# Patient Record
Sex: Female | Born: 1965
Health system: Southern US, Community
[De-identification: ages and names within clinical notes are randomized; demographics above are authoritative.]

## PROBLEM LIST (undated history)

## (undated) DIAGNOSIS — K219 Gastro-esophageal reflux disease without esophagitis: Secondary | ICD-10-CM

## (undated) DIAGNOSIS — N200 Calculus of kidney: Secondary | ICD-10-CM

## (undated) DIAGNOSIS — R51 Headache: Secondary | ICD-10-CM

## (undated) DIAGNOSIS — Z87442 Personal history of urinary calculi: Secondary | ICD-10-CM

## (undated) DIAGNOSIS — M7989 Other specified soft tissue disorders: Secondary | ICD-10-CM

## (undated) DIAGNOSIS — I499 Cardiac arrhythmia, unspecified: Secondary | ICD-10-CM

## (undated) DIAGNOSIS — R0602 Shortness of breath: Secondary | ICD-10-CM

## (undated) DIAGNOSIS — N809 Endometriosis, unspecified: Secondary | ICD-10-CM

## (undated) DIAGNOSIS — Z9889 Other specified postprocedural states: Secondary | ICD-10-CM

## (undated) DIAGNOSIS — I4891 Unspecified atrial fibrillation: Secondary | ICD-10-CM

## (undated) DIAGNOSIS — M352 Behcet's disease: Secondary | ICD-10-CM

## (undated) DIAGNOSIS — I480 Paroxysmal atrial fibrillation: Secondary | ICD-10-CM

## (undated) DIAGNOSIS — I10 Essential (primary) hypertension: Secondary | ICD-10-CM

## (undated) DIAGNOSIS — G4730 Sleep apnea, unspecified: Secondary | ICD-10-CM

## (undated) HISTORY — DX: Headache: R51

## (undated) HISTORY — DX: Behcet's disease: M35.2

## (undated) HISTORY — DX: Unspecified atrial fibrillation: I48.91

## (undated) HISTORY — DX: Shortness of breath: R06.02

## (undated) HISTORY — PX: APPENDECTOMY: SHX54

## (undated) HISTORY — PX: FOOT SURGERY: SHX648

## (undated) HISTORY — DX: Endometriosis, unspecified: N80.9

## (undated) HISTORY — DX: Calculus of kidney: N20.0

## (undated) HISTORY — DX: Paroxysmal atrial fibrillation: I48.0

## (undated) HISTORY — DX: Other specified soft tissue disorders: M79.89

## (undated) HISTORY — PX: ABDOMINAL HYSTERECTOMY: SHX81

---

## 2001-05-14 ENCOUNTER — Encounter (HOSPITAL_COMMUNITY): Admission: RE | Admit: 2001-05-14 | Discharge: 2001-06-13 | Payer: Self-pay | Admitting: Preventative Medicine

## 2001-05-21 ENCOUNTER — Encounter (HOSPITAL_COMMUNITY): Admission: RE | Admit: 2001-05-21 | Discharge: 2001-06-20 | Payer: Self-pay | Admitting: Orthopaedic Surgery

## 2004-09-16 ENCOUNTER — Ambulatory Visit (HOSPITAL_COMMUNITY): Admission: RE | Admit: 2004-09-16 | Discharge: 2004-09-16 | Payer: Self-pay | Admitting: Family Medicine

## 2006-03-07 ENCOUNTER — Ambulatory Visit: Payer: Self-pay | Admitting: Obstetrics and Gynecology

## 2007-03-09 ENCOUNTER — Ambulatory Visit: Payer: Self-pay | Admitting: Obstetrics and Gynecology

## 2008-03-18 ENCOUNTER — Ambulatory Visit: Payer: Self-pay | Admitting: Obstetrics and Gynecology

## 2013-03-20 ENCOUNTER — Encounter: Payer: Self-pay | Admitting: *Deleted

## 2013-03-21 ENCOUNTER — Ambulatory Visit (INDEPENDENT_AMBULATORY_CARE_PROVIDER_SITE_OTHER): Payer: Managed Care, Other (non HMO) | Admitting: Obstetrics and Gynecology

## 2013-03-21 ENCOUNTER — Encounter: Payer: Self-pay | Admitting: Obstetrics and Gynecology

## 2013-03-21 VITALS — BP 158/82 | Ht 64.5 in | Wt 198.0 lb

## 2013-03-21 DIAGNOSIS — N951 Menopausal and female climacteric states: Secondary | ICD-10-CM | POA: Insufficient documentation

## 2013-03-21 DIAGNOSIS — Z1212 Encounter for screening for malignant neoplasm of rectum: Secondary | ICD-10-CM

## 2013-03-21 DIAGNOSIS — Z01419 Encounter for gynecological examination (general) (routine) without abnormal findings: Secondary | ICD-10-CM

## 2013-03-21 LAB — HEMOCCULT GUIAC POC 1CARD (OFFICE): Fecal Occult Blood, POC: NEGATIVE

## 2013-03-21 MED ORDER — ZOLPIDEM TARTRATE 10 MG PO TABS: 10.0000 mg | ORAL_TABLET | Freq: Every evening | ORAL | Status: AC | PRN

## 2013-03-21 NOTE — Progress Notes (Signed)
Patient ID: Holly Krueger, female   DOB: 1966-02-23, 47 y.o.   MRN: [id number] Pt here today for annual exam, c/o hot flashes. Pt states had hysterectomy in 2002. Assessment:  Normal Gyn Exam Perimenopause   Plan:  1. Mammogram 2. No pap, s/p hyst 3. return annually or prn 4. Rx Estradiol 1mg  /d Subjective:  Holly Krueger is a 47 y.o. female G1P1 who presents for annual exam.  The patient has complaints today of vasomotor sx    Review of Systems Pertinent items are noted in HPI.  Objective:  BP 158/82  Ht 5' 4.5" (1.638 m)  Wt 198 lb (89.812 kg)  BMI 33.47 kg/m2  BMI: Body mass index is 33.47 kg/(m^2). General Appearance: Alert, appropriate appearance for age. No acute distress HEENT: Grossly normal Neck / Thyroid:  Cardiovascular: RRR; normal S1, S2, no murmur Lungs: CTA bilaterally Back: No CVAT Breast Exam: No dimpling, nipple retraction or discharge. No masses or nodes. and No masses or nodes.No dimpling, nipple retraction or discharge. Gastrointestinal: Soft, non-tender, no masses or organomegaly Pelvic Exam: External genitalia: normal general appearance Urinary system: urethral meatus normal Vaginal: normal mucosa without prolapse or lesions and cystocele present, mild Cervix: removed surgically Uterus: absent atrophic tissues Rectovaginal: normal rectal, no masses Lymphatic Exam: Non-palpable nodes in neck, clavicular, axillary, or inguinal regions Skin: no rash or abnormalities Neurologic: Normal gait and speech, no tremor  Psychiatric: Alert and oriented, appropriate affect.  Urinalysis:normal and Not done  Christin Bach. MD Pgr [phone] 2:56 PM

## 2013-03-21 NOTE — Patient Instructions (Addendum)
Remus Loffler should 5 mg daily up to 5 times/week maximum. Menopause and Herbal Products Menopause is the normal time of life when menstrual periods stop completely. Menopause is complete when you have missed 12 consecutive menstrual periods. It usually occurs between the ages of 63 to 43, with an average age of 52. Very rarely does a woman develop menopause before 49 years old. At menopause, your ovaries stop producing the female hormones, estrogen and progesterone. This can cause undesirable symptoms and also affect your health. Sometimes the symptoms can occur 4 to 5 years before the menopause begins. There is no relationship between menopause and:  Oral contraceptives.  Number of children you had.  Race.  The age your menstrual periods started (menarche). Heavy smokers and very thin women may develop menopause earlier in life. Estrogen and progesterone hormone treatment is the usual method of treating menopausal symptoms. However, there are women who should not take hormone treatment. This is true of:   Women that have breast or uterine cancer.  Women who prefer not to take hormones because of certain side effects (abnormal uterine bleeding).  Women who are afraid that hormones may cause breast cancer.  Women who have a history of liver disease, heart disease, stroke, or blood clots. For these women, there are other medications that may help treat their menopausal symptoms. These medications are found in plants and botanical products. They can be found in the form of herbs, teas, oils, tinctures, and pills.  CAUSES:  The ovaries stop producing the female hormones estrogen and progesterone.  Other causes include:  Surgery to remove both ovaries.  The ovaries stop functioning for no know reason.  Tumors of the pituitary gland in the brain.  Medical disease that affects the ovaries and hormone production.  Radiation treatment to the abdomen or pelvis.  Chemotherapy that affects the  ovaries. PHYTOESTROGENS: Phytoestrogens occur naturally in plants and plant products. They act like estrogen in the body. Herbal medications are made from these plants and botanical steroids. There are 3 types of phytoestrogens:  Isoflavones (genistein and daidzein) are found in soy, garbanzo beans, miso and tofu foods.  Ligins are found in the shell of seeds. They are used to make oils like flaxseed oil. The bacteria in your intestine act on these foods to produce the estrogen-like hormones.  Coumestans are estrogen-like. Some of the foods they are found in include sunflower seeds and bean sprouts. CONDITIONS AND THEIR POSSIBLE HERBAL TREATMENT:  Hot flashes and night sweats.  Soy, black cohosh and evening primrose.  Irritability, insomnia, depression and memory problems.  Chasteberry, ginseng, and soy.  St. Maressa Apollo's wort may be helpful for depression. However, there is a concern of it causing cataracts of the eye and may have bad effects on other medications. St. Laster Appling's wort should not be taken for long time and without your caregiver's advice.  Loss of libido and vaginal and skin dryness.  Wild yam and soy.  Prevention of coronary heart disease and osteoporosis.  Soy and Isoflavones. Several studies have shown that some women benefit from herbal medications, but most of the studies have not consistently shown that these supplements are much better than placebo. Other forms of treatment to help women with menopausal symptoms include a balanced diet, rest, exercise, vitamin and calcium (with vitamin D) supplements, acupuncture, and group therapy when necessary. THOSE WHO SHOULD NOT TAKE HERBAL MEDICATIONS INCLUDE:  Women who are planning on getting pregnant unless told by your caregiver.  Women who are breastfeeding unless  told by your caregiver.  Women who are taking other prescription medications unless told by your caregiver.  Infants, children, and elderly women unless told by  your caregiver. Different herbal medications have different and unmeasured amounts of the herbal ingredients. There are no regulations, quality control, and standardization of the ingredients in herbal medications. Therefore, the amount of the ingredient in the medication may vary from one herb, pill, tea, oil or tincture to another. Many herbal medications can cause serious problems and can even have poisonous effects if taken too much or too long. If problems develop, the medication should be stopped and recorded by your caregiver. HOME CARE INSTRUCTIONS  Do not take or give children herbal medications without your caregiver's advice.  Let your caregiver know all the medications you are taking. This includes prescription, over-the-counter, eye drops, and creams.  Do not take herbal medications longer or more than recommended.  Tell your caregiver about any side effects from the medication. SEEK MEDICAL CARE IF:  You develop a fever of 102 F (38.9 C), or as directed by your caregiver.  You feel sick to your stomach (nauseous), vomit, or have diarrhea.  You develop a rash.  You develop abdominal pain.  You develop severe headaches.  You start to have vision problems.  You feel dizzy or faint.  You start to feel numbness in any part of your body.  You start shaking (have convulsions). Document Released: 03/21/2008 Document Revised: 09/19/2012 Document Reviewed: 10/19/2010 [facility] Patient Information 2014 [place], [place].   Go to [facility] for info

## 2013-09-30 ENCOUNTER — Other Ambulatory Visit (HOSPITAL_COMMUNITY): Payer: Self-pay | Admitting: Internal Medicine

## 2013-09-30 DIAGNOSIS — Z139 Encounter for screening, unspecified: Secondary | ICD-10-CM

## 2013-10-04 ENCOUNTER — Ambulatory Visit (HOSPITAL_COMMUNITY)
Admission: RE | Admit: 2013-10-04 | Discharge: 2013-10-04 | Disposition: A | Discharge status: Home / Self Care | Payer: Managed Care, Other (non HMO) | Source: Ambulatory Visit | Attending: Internal Medicine | Admitting: Internal Medicine

## 2013-10-04 DIAGNOSIS — Z1231 Encounter for screening mammogram for malignant neoplasm of breast: Secondary | ICD-10-CM | POA: Insufficient documentation

## 2013-10-04 DIAGNOSIS — Z139 Encounter for screening, unspecified: Secondary | ICD-10-CM

## 2013-10-07 ENCOUNTER — Ambulatory Visit (HOSPITAL_COMMUNITY): Payer: Self-pay

## 2013-12-04 ENCOUNTER — Ambulatory Visit (INDEPENDENT_AMBULATORY_CARE_PROVIDER_SITE_OTHER): Payer: Managed Care, Other (non HMO) | Admitting: Obstetrics and Gynecology

## 2013-12-04 ENCOUNTER — Telehealth: Payer: Self-pay | Admitting: *Deleted

## 2013-12-04 ENCOUNTER — Encounter: Payer: Self-pay | Admitting: Obstetrics and Gynecology

## 2013-12-04 ENCOUNTER — Encounter (INDEPENDENT_AMBULATORY_CARE_PROVIDER_SITE_OTHER): Payer: Self-pay

## 2013-12-04 VITALS — BP 122/76 | Ht 64.0 in | Wt 205.0 lb

## 2013-12-04 DIAGNOSIS — R300 Dysuria: Secondary | ICD-10-CM

## 2013-12-04 DIAGNOSIS — N8111 Cystocele, midline: Secondary | ICD-10-CM

## 2013-12-04 DIAGNOSIS — R309 Painful micturition, unspecified: Secondary | ICD-10-CM

## 2013-12-04 DIAGNOSIS — IMO0002 Reserved for concepts with insufficient information to code with codable children: Secondary | ICD-10-CM

## 2013-12-04 LAB — POCT URINALYSIS DIPSTICK
Blood, UA: NEGATIVE
Glucose, UA: NEGATIVE
Ketones, UA: NEGATIVE
Leukocytes, UA: NEGATIVE
Nitrite, UA: NEGATIVE
Protein, UA: NEGATIVE

## 2013-12-04 MED ORDER — PHENAZOPYRIDINE HCL 200 MG PO TABS: 200.0000 mg | ORAL_TABLET | Freq: Three times a day (TID) | ORAL | Status: DC | PRN

## 2013-12-04 MED ORDER — SULFAMETHOXAZOLE-TMP DS 800-160 MG PO TABS: 1.0000 | ORAL_TABLET | Freq: Two times a day (BID) | ORAL | Status: DC

## 2013-12-04 NOTE — Telephone Encounter (Signed)
Spoke with pt. Having pressure, pain with urination. Started today. Can you order Rx? Pt is at work at [employer]. Uses [facility] in [place]. Thanks!!!

## 2013-12-04 NOTE — Patient Instructions (Addendum)
  We will send urine culture for confirmation of uti

## 2013-12-04 NOTE — Progress Notes (Signed)
This chart was scribed by Jenne Campus, Medical Scribe, for Dr. Mallory Shirk on 12/04/13 at 3:48 PM. This chart was reviewed by Dr. Mallory Shirk and is accurate.    [facility] Visit  Patient name: Holly Krueger MRN [id number]  Date of birth: 1966/07/11  CC & HPI:  Holly Krueger is a 48 y.o. female presenting today for pain and pressure with urination that started today. Denies any other symptoms. Reports that sxs are similar to prior bladder infections in the past. No infections recently. Denies increased water intake.  ROS:  + dysuria and nocturia with urgency Denies any other symptoms  Pertinent History Reviewed:  Medical & Surgical Hx:  Reviewed: Significant for s/p hysterectomy Medications: Reviewed & Updated - see associated section Social History: Reviewed -  reports that she has never smoked. She has never used smokeless tobacco.  Objective Findings:  Vitals: BP 122/76  Ht 5\' 4"  (1.626 m)  Wt 205 lb (92.987 kg)  BMI 35.17 kg/m2 Chaperone present for exam. Exam performed with pt's permission Physical Examination: General appearance - alert, well appearing, and in no distress and oriented to person, place, and time Mental status - alert, oriented to person, place, and time, normal mood, behavior, speech, dress, motor activity, and thought processes Pelvic - VULVA: normal appearing vulva with no masses, tenderness or lesions, VAGINA: normal appearing vagina with normal color and discharge, no lesions, small cystocele noted CERVIX: normal appearing cervix without discharge or lesions, UTERUS: absent, ADNEXA: surgically absent bilateral, RECTAL: not done Negative UA  Assessment & Plan:  UA culture. Septra DS x3 days BID, also Pyridium 200 TID

## 2013-12-04 NOTE — Telephone Encounter (Signed)
Seen today as appt.

## 2013-12-07 LAB — URINE CULTURE: Colony Count: 60000

## 2013-12-23 ENCOUNTER — Encounter: Payer: Self-pay | Admitting: Obstetrics and Gynecology

## 2013-12-23 ENCOUNTER — Ambulatory Visit (INDEPENDENT_AMBULATORY_CARE_PROVIDER_SITE_OTHER): Payer: Managed Care, Other (non HMO) | Admitting: Obstetrics and Gynecology

## 2013-12-23 VITALS — BP 142/88 | Ht 64.0 in | Wt 209.4 lb

## 2013-12-23 DIAGNOSIS — N8111 Cystocele, midline: Secondary | ICD-10-CM

## 2013-12-23 DIAGNOSIS — R300 Dysuria: Secondary | ICD-10-CM

## 2013-12-23 DIAGNOSIS — IMO0002 Reserved for concepts with insufficient information to code with codable children: Secondary | ICD-10-CM

## 2013-12-23 DIAGNOSIS — R309 Painful micturition, unspecified: Secondary | ICD-10-CM

## 2013-12-23 LAB — POCT URINALYSIS DIPSTICK
Blood, UA: NEGATIVE
Glucose, UA: NEGATIVE
Ketones, UA: NEGATIVE
Leukocytes, UA: NEGATIVE
Nitrite, UA: NEGATIVE
Protein, UA: NEGATIVE

## 2013-12-23 NOTE — Progress Notes (Signed)
This chart was scribed by Ludger Nutting, Medical Scribe, for Dr. Mallory Shirk on 12/23/13 at 2:43 PM. This chart was reviewed by Dr. Mallory Shirk and is accurate.  [facility] Visit  Patient name: Holly Krueger MRN [id number]  Date of birth: Nov 18, 1965  CC & HPI:  Reina Wilton is a 48 y.o. female presenting today for a cystocele follow up. She was diagnosed with a UTI on 12/04/13.   ROS:  Negative except for noted above  Pertinent History Reviewed:  Medical & Surgical Hx:  Reviewed: Significant for  Past Medical History  Diagnosis Date  . Headache(784.0)   . Kidney stone   . Endometriosis    Past Surgical History  Procedure Laterality Date  . Appendectomy    . Abdominal hysterectomy    . Foot surgery      Medications: Reviewed & Updated - see associated section Social History: Reviewed -  reports that she has never smoked. She has never used smokeless tobacco.  Objective Findings:  Vitals: BP 142/88  Ht 5\' 4"  (1.626 m)  Wt 209 lb 6.4 oz (94.983 kg)  BMI 35.93 kg/m2  Physical Examination: General appearance - alert, well appearing, and in no distress and oriented to person, place, and time Pelvic - VULVA: normal appearing vulva with no masses, tenderness or lesions,  VAGINA: normal appearing vagina with normal color and discharge, no lesions,  CERVIX: Not indicated UTERUS:Not indicated ADNEXA: Not indicated Bladder: 5.8 cm x 2.0 cm x3.4 cm   Assessment & Plan:   A: 1. Normal post void residual 15 cc 2. Cystocele   P:  1. Follow for now.

## 2014-02-11 ENCOUNTER — Other Ambulatory Visit (HOSPITAL_COMMUNITY): Payer: Self-pay | Admitting: Family Medicine

## 2014-02-11 ENCOUNTER — Encounter (INDEPENDENT_AMBULATORY_CARE_PROVIDER_SITE_OTHER): Payer: Self-pay

## 2014-02-11 ENCOUNTER — Ambulatory Visit (HOSPITAL_COMMUNITY)
Admission: RE | Admit: 2014-02-11 | Discharge: 2014-02-11 | Disposition: A | Discharge status: Home / Self Care | Payer: Managed Care, Other (non HMO) | Source: Ambulatory Visit | Attending: Family Medicine | Admitting: Family Medicine

## 2014-02-11 DIAGNOSIS — R079 Chest pain, unspecified: Secondary | ICD-10-CM | POA: Insufficient documentation

## 2014-02-11 DIAGNOSIS — J209 Acute bronchitis, unspecified: Secondary | ICD-10-CM

## 2014-02-11 DIAGNOSIS — R059 Cough, unspecified: Secondary | ICD-10-CM | POA: Insufficient documentation

## 2014-02-11 DIAGNOSIS — R0989 Other specified symptoms and signs involving the circulatory and respiratory systems: Secondary | ICD-10-CM | POA: Insufficient documentation

## 2014-02-11 DIAGNOSIS — R05 Cough: Secondary | ICD-10-CM | POA: Insufficient documentation

## 2014-02-11 DIAGNOSIS — R0602 Shortness of breath: Secondary | ICD-10-CM | POA: Insufficient documentation

## 2014-02-27 ENCOUNTER — Encounter (HOSPITAL_COMMUNITY): Payer: Self-pay | Admitting: Emergency Medicine

## 2014-02-27 ENCOUNTER — Emergency Department (HOSPITAL_COMMUNITY)
Admission: EM | Admit: 2014-02-27 | Discharge: 2014-02-27 | Disposition: A | Discharge status: Home / Self Care | Payer: Managed Care, Other (non HMO) | Attending: Emergency Medicine | Admitting: Emergency Medicine

## 2014-02-27 DIAGNOSIS — R209 Unspecified disturbances of skin sensation: Secondary | ICD-10-CM | POA: Insufficient documentation

## 2014-02-27 DIAGNOSIS — Z8742 Personal history of other diseases of the female genital tract: Secondary | ICD-10-CM | POA: Insufficient documentation

## 2014-02-27 DIAGNOSIS — Z79899 Other long term (current) drug therapy: Secondary | ICD-10-CM | POA: Insufficient documentation

## 2014-02-27 DIAGNOSIS — Z792 Long term (current) use of antibiotics: Secondary | ICD-10-CM | POA: Insufficient documentation

## 2014-02-27 DIAGNOSIS — Z87442 Personal history of urinary calculi: Secondary | ICD-10-CM | POA: Insufficient documentation

## 2014-02-27 DIAGNOSIS — R110 Nausea: Secondary | ICD-10-CM | POA: Insufficient documentation

## 2014-02-27 DIAGNOSIS — R42 Dizziness and giddiness: Secondary | ICD-10-CM | POA: Insufficient documentation

## 2014-02-27 LAB — CBC WITH DIFFERENTIAL/PLATELET
Basophils Absolute: 0 10*3/uL (ref 0.0–0.1)
Basophils Relative: 1 % (ref 0–1)
EOS PCT: 1 % (ref 0–5)
Eosinophils Absolute: 0.1 10*3/uL (ref 0.0–0.7)
HCT: 42.6 % (ref 36.0–46.0)
HEMOGLOBIN: 14.6 g/dL (ref 12.0–15.0)
Lymphocytes Relative: 20 % (ref 12–46)
Lymphs Abs: 1.3 10*3/uL (ref 0.7–4.0)
MCH: 30 pg (ref 26.0–34.0)
MCHC: 34.3 g/dL (ref 30.0–36.0)
MCV: 87.7 fL (ref 78.0–100.0)
Monocytes Absolute: 0.5 10*3/uL (ref 0.1–1.0)
Monocytes Relative: 7 % (ref 3–12)
Neutro Abs: 4.7 10*3/uL (ref 1.7–7.7)
Neutrophils Relative %: 71 % (ref 43–77)
Platelets: 273 10*3/uL (ref 150–400)
RBC: 4.86 MIL/uL (ref 3.87–5.11)
RDW: 12.8 % (ref 11.5–15.5)
WBC: 6.6 10*3/uL (ref 4.0–10.5)

## 2014-02-27 LAB — BASIC METABOLIC PANEL
BUN: 16 mg/dL (ref 6–23)
CO2: 22 mEq/L (ref 19–32)
Calcium: 9.5 mg/dL (ref 8.4–10.5)
Chloride: 104 mEq/L (ref 96–112)
Creatinine, Ser: 0.82 mg/dL (ref 0.50–1.10)
GFR calc Af Amer: >90 mL/min (ref >90)
GFR calc non Af Amer: 82 mL/min — ABNORMAL LOW (ref >90)
Glucose, Bld: 143 mg/dL — ABNORMAL HIGH (ref 70–99)
Potassium: 3.9 mEq/L (ref 3.7–5.3)
Sodium: 141 mEq/L (ref 137–147)

## 2014-02-27 MED ORDER — MECLIZINE HCL 25 MG PO TABS: 25.0000 mg | ORAL_TABLET | Freq: Three times a day (TID) | ORAL | Status: DC | PRN

## 2014-02-27 MED ORDER — MECLIZINE HCL 12.5 MG PO TABS
25.0000 mg | ORAL_TABLET | Freq: Once | ORAL | Status: AC
  Administered 2014-02-27: 25 mg via ORAL
  Filled 2014-02-27: qty 2

## 2014-02-27 MED ORDER — LORAZEPAM 2 MG/ML IJ SOLN
1.0000 mg | Freq: Once | INTRAMUSCULAR | Status: AC
  Administered 2014-02-27: 1 mg via INTRAVENOUS
  Filled 2014-02-27: qty 1

## 2014-02-27 MED ORDER — SODIUM CHLORIDE 0.9 % IV BOLUS (SEPSIS)
1000.0000 mL | Freq: Once | INTRAVENOUS | Status: AC
  Administered 2014-02-27: 1000 mL via INTRAVENOUS

## 2014-02-27 NOTE — ED Notes (Signed)
Attempted to perform postural vs on pt, unable to tolerate at this time

## 2014-02-27 NOTE — Discharge Instructions (Signed)
Meclizine as prescribed as needed for dizziness.  Return to the emergency department if you develop severe headache, worsening dizziness, or other new and concerning symptoms.   Benign Positional Vertigo Vertigo means you feel like you or your surroundings are moving when they are not. Benign positional vertigo is the most common form of vertigo. Benign means that the cause of your condition is not serious. Benign positional vertigo is more common in older adults. CAUSES  Benign positional vertigo is the result of an upset in the labyrinth system. This is an area in the middle ear that helps control your balance. This may be caused by a viral infection, head injury, or repetitive motion. However, often no specific cause is found. SYMPTOMS  Symptoms of benign positional vertigo occur when you move your head or eyes in different directions. Some of the symptoms may include:  Loss of balance and falls.  Vomiting.  Blurred vision.  Dizziness.  Nausea.  Involuntary eye movements (nystagmus). DIAGNOSIS  Benign positional vertigo is usually diagnosed by physical exam. If the specific cause of your benign positional vertigo is unknown, your caregiver may perform imaging tests, such as magnetic resonance imaging (MRI) or computed tomography (CT). TREATMENT  Your caregiver may recommend movements or procedures to correct the benign positional vertigo. Medicines such as meclizine, benzodiazepines, and medicines for nausea may be used to treat your symptoms. In rare cases, if your symptoms are caused by certain conditions that affect the inner ear, you may need surgery. HOME CARE INSTRUCTIONS   Follow your caregiver's instructions.  Move slowly. Do not make sudden body or head movements.  Avoid driving.  Avoid operating heavy machinery.  Avoid performing any tasks that would be dangerous to you or others during a vertigo episode.  Drink enough fluids to keep your urine clear or pale  yellow. SEEK IMMEDIATE MEDICAL CARE IF:   You develop problems with walking, weakness, numbness, or using your arms, hands, or legs.  You have difficulty speaking.  You develop severe headaches.  Your nausea or vomiting continues or gets worse.  You develop visual changes.  Your family or friends notice any behavioral changes.  Your condition gets worse.  You have a fever.  You develop a stiff neck or sensitivity to light. MAKE SURE YOU:   Understand these instructions.  Will watch your condition.  Will get help right away if you are not doing well or get worse. Document Released: 07/11/2006 Document Revised: 12/26/2011 Document Reviewed: 06/23/2011 [facility] Patient Information 2014 [place].

## 2014-02-27 NOTE — ED Notes (Signed)
Complain of nausea and dizziness. Pt hyperventilating

## 2014-02-27 NOTE — ED Notes (Signed)
Pt states improvement in nausea and dizziness as of this time.

## 2014-02-27 NOTE — ED Provider Notes (Signed)
CSN: [id number]     Arrival date & time 02/27/14  5631 History  This chart was scribed for Veryl Speak, MD,  by Stacy Gardner, ED Scribe. The patient was seen in room APA01/APA01 and the patient's care was started at 9:10 AM.    First MD Initiated Contact with Patient 02/27/14 [phone]     Chief Complaint  Patient presents with  . Dizziness     (Consider location/radiation/quality/duration/timing/severity/associated sxs/prior Treatment) Patient is a 48 y.o. female presenting with dizziness. The history is provided by the patient and medical records. No language interpreter was used.  Dizziness Associated symptoms: nausea   Associated symptoms: no vomiting    HPI Comments: Holly Krueger is a 48 y.o. female who presents to the Emergency Department complaining of dizziness, onset last night. She feels like the room is spinning,  which is worse when she turns her head. She is dry heaving. Denies vomiting. She has numbness and tingling to her left hand. Pt was sick for the past two weeks and just returned back to work this week. She is taking Ambien on a daily basis.   She is a [employer] manager.   Past Medical History  Diagnosis Date  . Headache(784.0)   . Kidney stone   . Endometriosis    Past Surgical History  Procedure Laterality Date  . Appendectomy    . Abdominal hysterectomy    . Foot surgery     Family History  Problem Relation Age of Onset  . Hyperlipidemia Mother   . Hypertension Father    History  Substance Use Topics  . Smoking status: Never Smoker   . Smokeless tobacco: Never Used  . Alcohol Use: No   OB History   Grav Para Term Preterm Abortions TAB SAB Ect Mult Living   1 1        1      Review of Systems  Gastrointestinal: Positive for nausea. Negative for vomiting.  Neurological: Positive for dizziness and numbness.       Tingling to left hand  All other systems reviewed and are negative.     Allergies  Codeine and Hydrocodone  Home Medications   Prior to  Admission medications   Medication Sig Start Date End Date Taking? Authorizing Provider  phenazopyridine (PYRIDIUM) 200 MG tablet Take 1 tablet (200 mg total) by mouth 3 (three) times daily as needed for pain. 12/04/13   Jonnie Kind, MD  sulfamethoxazole-trimethoprim (BACTRIM DS) 800-160 MG per tablet Take 1 tablet by mouth 2 (two) times daily. 12/04/13   Jonnie Kind, MD  zolpidem (AMBIEN) 10 MG tablet Take 1 tablet (10 mg total) by mouth at bedtime as needed for sleep. Up 5 times weekly 03/21/13 12/04/13  Jonnie Kind, MD   BP 137/88  Pulse 53  Resp 26  Ht 5\' 4"  (1.626 m)  Wt 196 lb (88.905 kg)  BMI 33.63 kg/m2  SpO2 100% Physical Exam  Nursing note and vitals reviewed. Constitutional: She is oriented to person, place, and time. She appears well-developed and well-nourished.  Appears anxious and uncomfortable   HENT:  Head: Normocephalic and atraumatic.  Mouth/Throat: Oropharynx is clear and moist. No oropharyngeal exudate.  Eyes: EOM are normal. Pupils are equal, round, and reactive to light.  Neck: Normal range of motion.  Cardiovascular: Normal rate, regular rhythm and normal heart sounds.  Exam reveals no gallop and no friction rub.   No murmur heard. Pulmonary/Chest: Effort normal and breath sounds normal. No respiratory distress. She has  no wheezes. She has no rales.  Neurological: She is alert and oriented to person, place, and time. Coordination normal.  Skin: Skin is warm and dry.  Psychiatric: She has a normal mood and affect. Her behavior is normal.    ED Course  Procedures (including critical care time) DIAGNOSTIC STUDIES: Oxygen Saturation is 100% on room air, normal by my interpretation.    COORDINATION OF CARE:  9:14 AM Discussed course of care with pt which includes EKG, laboratory tests, Antivert and Ativan . Pt understands and agrees.    Labs Review Labs Reviewed - No data to display  Imaging Review No results found.   EKG  Interpretation   Date/Time:  Thursday Feb 27 2014 09:03:26 EDT Ventricular Rate:  51 PR Interval:  145 QRS Duration: 106 QT Interval:  458 QTC Calculation: 422 R Axis:   -27 Text Interpretation:  Sinus rhythm Abnormal inferior Q waves No priors for  comparison Confirmed by DELOS  MD, Dorthula Bier ([id number]) on 02/27/2014 9:33:03 AM      MDM   Final diagnoses:  None    Patient is a 48 year old female who presents with complaints of dizziness. Her symptoms sound vertiginous in nature. They are worsened with movement and turning head and improved with lying still. She was given Ativan and she appeared quite anxious and meclizine and is now feeling much better.  Workup reveals no abnormalities in electrolytes, sugar, or blood counts. Her EKG is normal. At this point I feel as though she is appropriate for discharge and is to return if her symptoms substantially worsen or change. She will be prescribed meclizine as needed if symptoms recur.  I personally performed the services described in this documentation, which was scribed in my presence. The recorded information has been reviewed and is accurate.      Veryl Speak, MD 02/27/14 1055

## 2014-02-27 NOTE — ED Notes (Signed)
Pt left via wheel chair with husband- Discharge instructions and prescriptions reviewed. Verbalized understanding

## 2014-08-18 ENCOUNTER — Encounter (HOSPITAL_COMMUNITY): Payer: Self-pay | Admitting: Emergency Medicine

## 2015-09-21 ENCOUNTER — Other Ambulatory Visit: Payer: Self-pay | Admitting: Internal Medicine

## 2015-09-21 DIAGNOSIS — Z1231 Encounter for screening mammogram for malignant neoplasm of breast: Secondary | ICD-10-CM

## 2015-09-22 ENCOUNTER — Other Ambulatory Visit: Payer: Self-pay

## 2015-09-22 ENCOUNTER — Telehealth: Payer: Self-pay | Admitting: Gastroenterology

## 2015-09-22 DIAGNOSIS — Z1211 Encounter for screening for malignant neoplasm of colon: Secondary | ICD-10-CM

## 2015-09-22 NOTE — Telephone Encounter (Signed)
PT IS SET UP FOR 10/02/15 AT 830

## 2015-09-22 NOTE — Telephone Encounter (Signed)
SCREENING TCS WITH PREPOPIK IN DEC 2016. FULL LIQUIDS WITH BREAKFAST.  Full Liquid Diet A high-calorie, high-protein supplement should be used to meet your nutritional requirements when the full liquid diet is continued for more than 2 or 3 days. If this diet is to be used for an extended period of time (more than 7 days), a multivitamin should be considered.  Breads and Starches  Allowed: None are allowed except crackers pureed (made into a thick, smooth soup) in soup.   Avoid: Any others.    Potatoes/Pasta/Rice  Allowed: ANY ITEM AS A SOUP OR SMALL PLATE OF MASHED POTATOES OR SCRAMBLED EGGS.       Vegetables  Allowed: Strained tomato or vegetable juice. Vegetables pureed in soup.   Avoid: Any others.    Fruit  Allowed: Any strained fruit juices and fruit drinks. Include 1 serving of citrus or vitamin C-enriched fruit juice daily.   Avoid: Any others.  Meat and Meat Substitutes  Allowed: Egg  Avoid: Any meat, fish, or fowl. All cheese.  Milk  Allowed: SOY Milk beverages, including milk shakes and instant breakfast mixes. Smooth yogurt.   Avoid: Any others. Avoid dairy products if not tolerated.    Soups and Combination Foods  Allowed: Broth, strained cream soups. Strained, broth-based soups.   Avoid: Any others.    Desserts and Sweets  Allowed: flavored gelatin, tapioca, ice cream, sherbet, smooth pudding, junket, fruit ices, frozen ice pops, pudding pops, frozen fudge pops, chocolate syrup. Sugar, honey, jelly, syrup.   Avoid: Any others.  Fats and Oils  Allowed: Margarine, butter, cream, sour cream, oils.   Avoid: Any others.  Beverages  Allowed: All.   Avoid: None.  Condiments  Allowed: Iodized salt, pepper, spices, flavorings. Cocoa powder.   Avoid: Any others.    SAMPLE MEAL PLAN Breakfast   cup orange juice.   1 OR 2 EGGS  1 cup milk.   1 cup beverage (coffee or tea).   Cream or sugar, if desired.    Midmorning Snack  2  SCRAMBLED OR HARD BOILED EGG   Lunch  1 cup cream soup.    cup fruit juice.   1 cup milk.    cup custard.   1 cup beverage (coffee or tea).   Cream or sugar, if desired.    Midafternoon Snack  1 cup milk shake.  Dinner  1 cup cream soup.    cup fruit juice.   1 cup MILK    cup pudding.   1 cup beverage (coffee or tea).   Cream or sugar, if desired.  Evening Snack  1 cup supplement.  To increase calories, add sugar, cream, butter, or margarine if possible. Nutritional supplements will also increase the total calories.

## 2015-09-28 ENCOUNTER — Ambulatory Visit (HOSPITAL_COMMUNITY)
Admission: RE | Admit: 2015-09-28 | Discharge: 2015-09-28 | Disposition: A | Discharge status: Home / Self Care | Payer: Managed Care, Other (non HMO) | Source: Ambulatory Visit | Attending: Internal Medicine | Admitting: Internal Medicine

## 2015-09-28 DIAGNOSIS — Z1231 Encounter for screening mammogram for malignant neoplasm of breast: Secondary | ICD-10-CM | POA: Diagnosis present

## 2015-10-02 ENCOUNTER — Encounter (HOSPITAL_COMMUNITY): Payer: Self-pay | Admitting: *Deleted

## 2015-10-02 ENCOUNTER — Encounter (HOSPITAL_COMMUNITY)
Admission: RE | Disposition: A | Discharge status: Home / Self Care | Payer: Self-pay | Source: Ambulatory Visit | Attending: Gastroenterology

## 2015-10-02 ENCOUNTER — Ambulatory Visit (HOSPITAL_COMMUNITY)
Admission: RE | Admit: 2015-10-02 | Discharge: 2015-10-02 | Disposition: A | Discharge status: Home / Self Care | Payer: Managed Care, Other (non HMO) | Source: Ambulatory Visit | Attending: Gastroenterology | Admitting: Gastroenterology

## 2015-10-02 DIAGNOSIS — K644 Residual hemorrhoidal skin tags: Secondary | ICD-10-CM | POA: Insufficient documentation

## 2015-10-02 DIAGNOSIS — Z1211 Encounter for screening for malignant neoplasm of colon: Secondary | ICD-10-CM

## 2015-10-02 DIAGNOSIS — Z90710 Acquired absence of both cervix and uterus: Secondary | ICD-10-CM | POA: Diagnosis not present

## 2015-10-02 DIAGNOSIS — Z87442 Personal history of urinary calculi: Secondary | ICD-10-CM | POA: Insufficient documentation

## 2015-10-02 DIAGNOSIS — K5730 Diverticulosis of large intestine without perforation or abscess without bleeding: Secondary | ICD-10-CM | POA: Insufficient documentation

## 2015-10-02 DIAGNOSIS — K648 Other hemorrhoids: Secondary | ICD-10-CM | POA: Insufficient documentation

## 2015-10-02 DIAGNOSIS — Z885 Allergy status to narcotic agent status: Secondary | ICD-10-CM | POA: Insufficient documentation

## 2015-10-02 HISTORY — PX: COLONOSCOPY: SHX5424

## 2015-10-02 SURGERY — COLONOSCOPY
Anesthesia: Moderate Sedation

## 2015-10-02 MED ORDER — SIMETHICONE 40 MG/0.6ML PO SUSP
ORAL | Status: AC
  Filled 2015-10-02: qty 30

## 2015-10-02 MED ORDER — MEPERIDINE HCL 100 MG/ML IJ SOLN
INTRAMUSCULAR | Status: DC | PRN
  Administered 2015-10-02 (×4): 25 mg via INTRAVENOUS

## 2015-10-02 MED ORDER — STERILE WATER FOR IRRIGATION IR SOLN
Status: DC | PRN
  Administered 2015-10-02: 09:00:00

## 2015-10-02 MED ORDER — MEPERIDINE HCL 100 MG/ML IJ SOLN
INTRAMUSCULAR | Status: AC
  Filled 2015-10-02: qty 2

## 2015-10-02 MED ORDER — MIDAZOLAM HCL 5 MG/5ML IJ SOLN
INTRAMUSCULAR | Status: AC
  Filled 2015-10-02: qty 10

## 2015-10-02 MED ORDER — MIDAZOLAM HCL 5 MG/5ML IJ SOLN
INTRAMUSCULAR | Status: DC | PRN
  Administered 2015-10-02: 2 mg via INTRAVENOUS
  Administered 2015-10-02: 1 mg via INTRAVENOUS
  Administered 2015-10-02: 2 mg via INTRAVENOUS
  Administered 2015-10-02: 1 mg via INTRAVENOUS
  Administered 2015-10-02: 2 mg via INTRAVENOUS
  Administered 2015-10-02: 1 mg via INTRAVENOUS

## 2015-10-02 MED ORDER — SODIUM CHLORIDE 0.9 % IV SOLN: INTRAVENOUS | Status: DC

## 2015-10-02 MED ORDER — BUTAMBEN-TETRACAINE-BENZOCAINE 2-2-14 % EX AERO
INHALATION_SPRAY | CUTANEOUS | Status: AC
  Filled 2015-10-02: qty 20

## 2015-10-02 NOTE — Discharge Instructions (Signed)
You have small internal hemorrhoids and diverticulosis IN YOUR LEFT COLON. YOU HAVE A FLOPPY LEFT COLON.  CONTINUE YOUR WEIGHT LOSS EFFORTS. LOSE 10 LBS.  DRINK WATER TO KEEP YOUR URINE LIGHT YELLOW.  FOLLOW A HIGH FIBER DIET. AVOID ITEMS THAT CAUSE BLOATING & GAS. See info below.  Next colonoscopy in 10 years WITH AN OVERTUBE. Colonoscopy Care After Read the instructions outlined below and refer to this sheet in the next week. These discharge instructions provide you with general information on caring for yourself after you leave the hospital. While your treatment has been planned according to the most current medical practices available, unavoidable complications occasionally occur. If you have any problems or questions after discharge, call DR. Jaben Benegas, [phone].  ACTIVITY  You may resume your regular activity, but move at a slower pace for the next 24 hours.   Take frequent rest periods for the next 24 hours.   Walking will help get rid of the air and reduce the bloated feeling in your belly (abdomen).   No driving for 24 hours (because of the medicine (anesthesia) used during the test).   You may shower.   Do not sign any important legal documents or operate any machinery for 24 hours (because of the anesthesia used during the test).    NUTRITION  Drink plenty of fluids.   You may resume your normal diet as instructed by your doctor.   Begin with a light meal and progress to your normal diet. Heavy or fried foods are harder to digest and may make you feel sick to your stomach (nauseated).   Avoid alcoholic beverages for 24 hours or as instructed.    MEDICATIONS  You may resume your normal medications.   WHAT YOU CAN EXPECT TODAY  Some feelings of bloating in the abdomen.   Passage of more gas than usual.   Spotting of blood in your stool or on the toilet paper  .  IF YOU HAD POLYPS REMOVED DURING THE COLONOSCOPY:  Eat a soft diet IF YOU HAVE NAUSEA,  BLOATING, ABDOMINAL PAIN, OR VOMITING.    FINDING OUT THE RESULTS OF YOUR TEST Not all test results are available during your visit. DR. Oneida Alar WILL CALL YOU WITHIN 7 DAYS OF YOUR PROCEDUE WITH YOUR RESULTS. Do not assume everything is normal if you have not heard from DR. Jamelle Goldston IN ONE WEEK, CALL HER OFFICE AT [phone].  SEEK IMMEDIATE MEDICAL ATTENTION AND CALL THE OFFICE: [phone] IF:  You have more than a spotting of blood in your stool.   Your belly is swollen (abdominal distention).   You are nauseated or vomiting.   You have a temperature over 101F.   You have abdominal pain or discomfort that is severe or gets worse throughout the day.  High-Fiber Diet A high-fiber diet changes your normal diet to include more whole grains, legumes, fruits, and vegetables. Changes in the diet involve replacing refined carbohydrates with unrefined foods. The calorie level of the diet is essentially unchanged. The Dietary Reference Intake (recommended amount) for adult males is 38 grams per day. For adult females, it is 25 grams per day. Pregnant and lactating women should consume 28 grams of fiber per day. Fiber is the intact part of a plant that is not broken down during digestion. Functional fiber is fiber that has been isolated from the plant to provide a beneficial effect in the body. PURPOSE  Increase stool bulk.   Ease and regulate bowel movements.   Lower cholesterol.  REDUCE RISK OF COLON CANCER  INDICATIONS THAT YOU NEED MORE FIBER  Constipation and hemorrhoids.   Uncomplicated diverticulosis (intestine condition) and irritable bowel syndrome.   Weight management.   As a protective measure against hardening of the arteries (atherosclerosis), diabetes, and cancer.   GUIDELINES FOR INCREASING FIBER IN THE DIET  Start adding fiber to the diet slowly. A gradual increase of about 5 more grams (2 slices of whole-wheat bread, 2 servings of most fruits or vegetables, or 1  bowl of high-fiber cereal) per day is best. Too rapid an increase in fiber may result in constipation, flatulence, and bloating.   Drink enough water and fluids to keep your urine clear or pale yellow. Water, juice, or caffeine-free drinks are recommended. Not drinking enough fluid may cause constipation.   Eat a variety of high-fiber foods rather than one type of fiber.   Try to increase your intake of fiber through using high-fiber foods rather than fiber pills or supplements that contain small amounts of fiber.   The goal is to change the types of food eaten. Do not supplement your present diet with high-fiber foods, but replace foods in your present diet.   INCLUDE A VARIETY OF FIBER SOURCES  Replace refined and processed grains with whole grains, canned fruits with fresh fruits, and incorporate other fiber sources. White rice, white breads, and most bakery goods contain little or no fiber.   Brown whole-grain rice, buckwheat oats, and many fruits and vegetables are all good sources of fiber. These include: broccoli, Brussels sprouts, cabbage, cauliflower, beets, sweet potatoes, white potatoes (skin on), carrots, tomatoes, eggplant, squash, berries, fresh fruits, and dried fruits.   Cereals appear to be the richest source of fiber. Cereal fiber is found in whole grains and bran. Bran is the fiber-rich outer coat of cereal grain, which is largely removed in refining. In whole-grain cereals, the bran remains. In breakfast cereals, the largest amount of fiber is found in those with "bran" in their names. The fiber content is sometimes indicated on the label.   You may need to include additional fruits and vegetables each day.   In baking, for 1 cup white flour, you may use the following substitutions:   1 cup whole-wheat flour minus 2 tablespoons.   1/2 cup white flour plus 1/2 cup whole-wheat flour.   Diverticulosis Diverticulosis is a common condition that develops when small pouches  (diverticula) form in the wall of the colon. The risk of diverticulosis increases with age. It happens more often in people who eat a low-fiber diet. Most individuals with diverticulosis have no symptoms. Those individuals with symptoms usually experience belly (abdominal) pain, constipation, or loose stools (diarrhea).  HOME CARE INSTRUCTIONS  Increase the amount of fiber in your diet as directed by your caregiver or dietician. This may reduce symptoms of diverticulosis.   Drink at least 6 to 8 glasses of water each day to prevent constipation.   Try not to strain when you have a bowel movement.   Avoiding nuts and seeds to prevent complications is NOT NECESSARY.    FOODS HAVING HIGH FIBER CONTENT INCLUDE:  Fruits. Apple, peach, pear, tangerine, raisins, prunes.   Vegetables. Brussels sprouts, asparagus, broccoli, cabbage, carrot, cauliflower, romaine lettuce, spinach, summer squash, tomato, winter squash, zucchini.   Starchy Vegetables. Baked beans, kidney beans, lima beans, split peas, lentils, potatoes (with skin).   Grains. Whole wheat bread, brown rice, bran flake cereal, plain oatmeal, white rice, shredded wheat, bran muffins.   SEEK  IMMEDIATE MEDICAL CARE IF:  You develop increasing pain or severe bloating.   You have an oral temperature above 101F.   You develop vomiting or bowel movements that are bloody or black.   Hemorrhoids Hemorrhoids are dilated (enlarged) veins around the rectum. Sometimes clots will form in the veins. This makes them swollen and painful. These are called thrombosed hemorrhoids. Causes of hemorrhoids include:  Constipation.   Straining to have a bowel movement.   HEAVY LIFTING  HOME CARE INSTRUCTIONS  Eat a well balanced diet and drink 6 to 8 glasses of water every day to avoid constipation. You may also use a bulk laxative.   Avoid straining to have bowel movements.   Keep anal area dry and clean.   Do not use a donut shaped pillow  or sit on the toilet for long periods. This increases blood pooling and pain.   Move your bowels when your body has the urge; this will require less straining and will decrease pain and pressure.

## 2015-10-02 NOTE — Op Note (Signed)
[facility] [address] [place], [address]   COLONOSCOPY PROCEDURE REPORT  PATIENT: Holly Krueger, Holly Krueger  MR#: [id number] BIRTHDATE: 1966-05-28 , 49  yrs. old GENDER: female ENDOSCOPIST: Danie Binder, MD REFERRED [id number] Hilma Favors, M.D. PROCEDURE DATE:  10/11/15 PROCEDURE:   Colonoscopy, screening  INDICATIONS:average risk patient for colon cancer.   MEDICATIONS: Demerol 100 mg IV and Versed 9 mg IV  DESCRIPTION OF PROCEDURE:    Physical exam was performed.  Informed consent was obtained from the patient after explaining the benefits, risks, and alternatives to procedure.  The patient was connected to monitor and placed in left lateral position. Continuous oxygen was provided by nasal cannula and IV medicine administered through an indwelling cannula.  After administration of sedation and rectal exam, the patients rectum was intubated and the [id number]  colonoscope was advanced under direct visualization to the cecum.  The scope was removed slowly by carefully examining the color, texture, anatomy, and integrity mucosa on the way out.  The patient was recovered in endoscopy and discharged home in satisfactory condition. Estimated blood loss is zero unless otherwise noted in this procedure report.     COLON FINDINGS: The colon was redundant.  Manual abdominal counter-pressure was used to reach the cecum.  The patient was moved on to their back to reach the cecum, There was moderate diverticulosis noted in the sigmoid colon and descending colon with associated tortuosity and muscular hypertrophy.  , Small internal hemorrhoids were found.  , and Moderate sized external hemorrhoids were found.  PREP QUALITY: excellent.  CECAL W/D TIME: 8       minutes COMPLICATIONS: None  ENDOSCOPIC IMPRESSION: 1.   The LEFT colon IS redundant 2.   Moderate diverticulosis in the sigmoid and descending colon 3.   Small internal hemorrhoids 4.   Moderate sized  external hemorrhoids  RECOMMENDATIONS: HIGH FIBER DIET NEXT TCS IN 10 YEARS WITH AN OVERTUBE      _______________________________ eSignedDanie Binder, MD 2015/10/11 9:23 AM    CPT CODES: ICD CODES:  The ICD and CPT codes recommended by this software are interpretations from the data that the clinical staff has captured with the software.  The verification of the translation of this report to the ICD and CPT codes and modifiers is the sole responsibility of the health care institution and practicing physician where this report was generated.  [employer]. will not be held responsible for the validity of the ICD and CPT codes included on this report.  AMA assumes no liability for data contained or not contained herein. CPT is a Designer, television/film set of the [employer].

## 2015-10-02 NOTE — H&P (Signed)
  Primary Care Physician:  Purvis Kilts, MD Primary Gastroenterologist:  Dr. Oneida Alar  Pre-Procedure History & Physical: HPI:  Holly Krueger is a 49 y.o. female here for [place].  Past Medical History  Diagnosis Date  . Headache(784.0)   . Kidney stone   . Endometriosis     Past Surgical History  Procedure Laterality Date  . Appendectomy    . Abdominal hysterectomy    . Foot surgery      Prior to Admission medications   Medication Sig Start Date End Date Taking? Authorizing Provider  diphenhydramine-acetaminophen (TYLENOL PM) 25-500 MG TABS tablet Take 1-2 tablets by mouth at bedtime as needed (sleep).   Yes Historical Provider, MD  zolpidem (AMBIEN) 10 MG tablet Take 1 tablet (10 mg total) by mouth at bedtime as needed for sleep. Up 5 times weekly 03/21/13  Yes Jonnie Kind, MD    Allergies as of 09/22/2015 - Review Complete 02/27/2014  Allergen Reaction Noted  . Codeine Hives 03/20/2013  . Hydrocodone Hives 03/20/2013    Family History  Problem Relation Age of Onset  . Hyperlipidemia Mother   . Hypertension Father     Social History   Social History  . Marital Status: Married    Spouse Name: N/A  . Number of Children: N/A  . Years of Education: N/A   Occupational History  . Not on file.   Social History Main Topics  . Smoking status: Never Smoker   . Smokeless tobacco: Never Used  . Alcohol Use: No  . Drug Use: No  . Sexual Activity: Yes   Other Topics Concern  . Not on file   Social History Narrative    Review of Systems: See HPI, otherwise negative ROS   Physical Exam: BP 143/90 mmHg  Pulse 80  Temp(Src) 97.6 F (36.4 C) (Oral)  Resp 22  Ht 5\' 4"  (1.626 m)  Wt 180 lb (81.647 kg)  BMI 30.88 kg/m2  SpO2 99% General:   Alert,  pleasant and cooperative in NAD Head:  Normocephalic and atraumatic. Neck:  Supple; Lungs:  Clear throughout to auscultation.    Heart:  Regular rate and rhythm. Abdomen:  Soft, nontender and  nondistended. Normal bowel sounds, without guarding, and without rebound.   Neurologic:  Alert and  oriented x4;  grossly normal neurologically.  Impression/Plan:     SCREENING  Plan:  1. TCS TODAY

## 2015-10-08 ENCOUNTER — Encounter (HOSPITAL_COMMUNITY): Payer: Self-pay | Admitting: Gastroenterology

## 2015-11-06 ENCOUNTER — Encounter (HOSPITAL_COMMUNITY): Payer: Self-pay

## 2015-11-06 ENCOUNTER — Emergency Department (HOSPITAL_COMMUNITY)
Admission: EM | Admit: 2015-11-06 | Discharge: 2015-11-06 | Disposition: A | Discharge status: Home / Self Care | Payer: PRIVATE HEALTH INSURANCE | Attending: Emergency Medicine | Admitting: Emergency Medicine

## 2015-11-06 DIAGNOSIS — Z87442 Personal history of urinary calculi: Secondary | ICD-10-CM | POA: Insufficient documentation

## 2015-11-06 DIAGNOSIS — Y9289 Other specified places as the place of occurrence of the external cause: Secondary | ICD-10-CM | POA: Insufficient documentation

## 2015-11-06 DIAGNOSIS — T2014XA Burn of first degree of nose (septum), initial encounter: Secondary | ICD-10-CM | POA: Diagnosis not present

## 2015-11-06 DIAGNOSIS — T23022A Burn of unspecified degree of single left finger (nail) except thumb, initial encounter: Secondary | ICD-10-CM | POA: Diagnosis present

## 2015-11-06 DIAGNOSIS — Y998 Other external cause status: Secondary | ICD-10-CM | POA: Insufficient documentation

## 2015-11-06 DIAGNOSIS — T22111A Burn of first degree of right forearm, initial encounter: Secondary | ICD-10-CM | POA: Diagnosis not present

## 2015-11-06 DIAGNOSIS — Y9389 Activity, other specified: Secondary | ICD-10-CM | POA: Insufficient documentation

## 2015-11-06 DIAGNOSIS — X150XXA Contact with hot stove (kitchen), initial encounter: Secondary | ICD-10-CM | POA: Insufficient documentation

## 2015-11-06 DIAGNOSIS — Z8742 Personal history of other diseases of the female genital tract: Secondary | ICD-10-CM | POA: Insufficient documentation

## 2015-11-06 DIAGNOSIS — T2016XA Burn of first degree of forehead and cheek, initial encounter: Secondary | ICD-10-CM | POA: Insufficient documentation

## 2015-11-06 DIAGNOSIS — T300 Burn of unspecified body region, unspecified degree: Secondary | ICD-10-CM

## 2015-11-06 DIAGNOSIS — T23122A Burn of first degree of single left finger (nail) except thumb, initial encounter: Secondary | ICD-10-CM | POA: Diagnosis not present

## 2015-11-06 MED ORDER — SILVER SULFADIAZINE 1 % EX CREA
TOPICAL_CREAM | CUTANEOUS | Status: AC
  Filled 2015-11-06: qty 50

## 2015-11-06 MED ORDER — SILVER SULFADIAZINE 1 % EX CREA
TOPICAL_CREAM | Freq: Once | CUTANEOUS | Status: AC
  Administered 2015-11-06: 14:00:00 via TOPICAL

## 2015-11-06 MED ORDER — OXYCODONE-ACETAMINOPHEN 5-325 MG PO TABS: 1.0000 | ORAL_TABLET | ORAL | Status: DC | PRN
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
## 2015-11-06 NOTE — Discharge Instructions (Signed)
Burn Care Your skin is a natural barrier to infection. It is the largest organ of your body. Burns damage this natural protection. To help prevent infection, it is very important to follow your caregiver's instructions in the care of your burn. Burns are classified as:  First degree. There is only redness of the skin (erythema). No scarring is expected.  Second degree. There is blistering of the skin. Scarring may occur with deeper burns.  Third degree. All layers of the skin are injured, and scarring is expected. HOME CARE INSTRUCTIONS   Wash your hands well before changing your bandage.  Change your bandage as often as directed by your caregiver.  Remove the old bandage. If the bandage sticks, you may soak it off with cool, clean water.  Cleanse the burn thoroughly but gently with mild soap and water.  Pat the area dry with a clean, dry cloth.  Apply a thin layer of antibacterial cream to the burn.  Apply a clean bandage as instructed by your caregiver.  Keep the bandage as clean and dry as possible.  Elevate the affected area for the first 24 hours, then as instructed by your caregiver.  Only take over-the-counter or prescription medicines for pain, discomfort, or fever as directed by your caregiver. SEEK IMMEDIATE MEDICAL CARE IF:   You develop excessive pain.  You develop redness, tenderness, swelling, or red streaks near the burn.  The burned area develops yellowish-white fluid (pus) or a bad smell.  You have a fever. MAKE SURE YOU:   Understand these instructions.  Will watch your condition.  Will get help right away if you are not doing well or get worse.   This information is not intended to replace advice given to you by your health care provider. Make sure you discuss any questions you have with your health care provider.   Document Released: 10/03/2005 Document Revised: 12/26/2011 Document Reviewed: 02/23/2011 Elsevier Interactive Patient Education 2016  [place] may take the oxycodone prescribed for pain relief.  This will make you drowsy - do not drive within 4 hours of taking this medication. Motrin will also help with pain control any may be all you need once these burns are a few days older.
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
## 2016-12-26 ENCOUNTER — Other Ambulatory Visit: Payer: Self-pay

## 2016-12-26 ENCOUNTER — Telehealth: Payer: Self-pay | Admitting: Gastroenterology

## 2016-12-26 DIAGNOSIS — R197 Diarrhea, unspecified: Secondary | ICD-10-CM

## 2016-12-26 MED ORDER — OMEPRAZOLE 20 MG PO CPDR: DELAYED_RELEASE_CAPSULE | ORAL | 11 refills | Status: DC

## 2016-12-26 MED ORDER — RANITIDINE HCL 150 MG PO TABS: 150.0000 mg | ORAL_TABLET | Freq: Two times a day (BID) | ORAL | 11 refills | Status: DC

## 2016-12-26 NOTE — Telephone Encounter (Signed)
Pt is aware of results and she will call back to set up EGD.She will come by to pick up cups for stool sample.
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
## 2016-12-27 ENCOUNTER — Other Ambulatory Visit (HOSPITAL_COMMUNITY)
Admission: RE | Admit: 2016-12-27 | Discharge: 2016-12-27 | Disposition: A | Discharge status: Home / Self Care | Payer: 59 | Source: Other Acute Inpatient Hospital | Attending: Gastroenterology | Admitting: Gastroenterology

## 2016-12-27 DIAGNOSIS — R197 Diarrhea, unspecified: Secondary | ICD-10-CM | POA: Diagnosis not present

## 2016-12-27 LAB — C DIFFICILE QUICK SCREEN W PCR REFLEX
C Diff antigen: NEGATIVE
C Diff interpretation: NOT DETECTED
C Diff toxin: NEGATIVE

## 2016-12-27 NOTE — Telephone Encounter (Signed)
Pt called and she got the stool cups from the hospital. I faxed her the paperwork to turn into the lab.

## 2016-12-28 NOTE — Telephone Encounter (Signed)
Noted  

## 2016-12-29 LAB — GIARDIA/CRYPTOSPORIDIUM EIA
Cryptosporidium EIA: NEGATIVE
GIARDIA AG STL: NEGATIVE

## 2017-01-30 ENCOUNTER — Other Ambulatory Visit (HOSPITAL_COMMUNITY): Payer: Self-pay | Admitting: Family Medicine

## 2017-01-30 ENCOUNTER — Ambulatory Visit (HOSPITAL_COMMUNITY)
Admission: RE | Admit: 2017-01-30 | Discharge: 2017-01-30 | Disposition: A | Discharge status: Home / Self Care | Payer: 59 | Source: Ambulatory Visit | Attending: Family Medicine | Admitting: Family Medicine

## 2017-01-30 DIAGNOSIS — J209 Acute bronchitis, unspecified: Secondary | ICD-10-CM | POA: Insufficient documentation

## 2017-01-30 DIAGNOSIS — J019 Acute sinusitis, unspecified: Secondary | ICD-10-CM | POA: Diagnosis present

## 2017-02-08 NOTE — Telephone Encounter (Signed)
Pt hasn't called back to schedule EGD. Letter mailed to pt.

## 2017-03-30 ENCOUNTER — Ambulatory Visit (INDEPENDENT_AMBULATORY_CARE_PROVIDER_SITE_OTHER): Payer: 59 | Admitting: Otolaryngology

## 2017-03-30 DIAGNOSIS — J310 Chronic rhinitis: Secondary | ICD-10-CM | POA: Diagnosis not present

## 2017-03-30 DIAGNOSIS — J343 Hypertrophy of nasal turbinates: Secondary | ICD-10-CM

## 2017-03-31 ENCOUNTER — Other Ambulatory Visit (INDEPENDENT_AMBULATORY_CARE_PROVIDER_SITE_OTHER): Payer: Self-pay | Admitting: Otolaryngology

## 2017-03-31 DIAGNOSIS — J329 Chronic sinusitis, unspecified: Secondary | ICD-10-CM

## 2017-03-31 DIAGNOSIS — J0101 Acute recurrent maxillary sinusitis: Secondary | ICD-10-CM

## 2017-04-03 ENCOUNTER — Ambulatory Visit (HOSPITAL_COMMUNITY)
Admission: RE | Admit: 2017-04-03 | Discharge: 2017-04-03 | Disposition: A | Discharge status: Home / Self Care | Payer: 59 | Source: Ambulatory Visit | Attending: Otolaryngology | Admitting: Otolaryngology

## 2017-04-03 ENCOUNTER — Ambulatory Visit (HOSPITAL_COMMUNITY): Payer: 59

## 2017-04-03 DIAGNOSIS — J320 Chronic maxillary sinusitis: Secondary | ICD-10-CM | POA: Diagnosis present

## 2017-04-03 DIAGNOSIS — J0101 Acute recurrent maxillary sinusitis: Secondary | ICD-10-CM

## 2017-04-03 DIAGNOSIS — J329 Chronic sinusitis, unspecified: Secondary | ICD-10-CM

## 2017-04-13 ENCOUNTER — Ambulatory Visit (INDEPENDENT_AMBULATORY_CARE_PROVIDER_SITE_OTHER): Payer: 59 | Admitting: Otolaryngology

## 2017-04-13 DIAGNOSIS — J320 Chronic maxillary sinusitis: Secondary | ICD-10-CM | POA: Diagnosis not present

## 2017-04-13 DIAGNOSIS — J310 Chronic rhinitis: Secondary | ICD-10-CM

## 2017-08-10 ENCOUNTER — Ambulatory Visit (INDEPENDENT_AMBULATORY_CARE_PROVIDER_SITE_OTHER): Payer: 59 | Admitting: Otolaryngology

## 2017-08-10 DIAGNOSIS — J343 Hypertrophy of nasal turbinates: Secondary | ICD-10-CM | POA: Diagnosis not present

## 2017-08-10 DIAGNOSIS — J310 Chronic rhinitis: Secondary | ICD-10-CM | POA: Diagnosis not present

## 2018-02-06 ENCOUNTER — Other Ambulatory Visit (HOSPITAL_COMMUNITY): Payer: Self-pay | Admitting: Physician Assistant

## 2018-02-06 DIAGNOSIS — Z1231 Encounter for screening mammogram for malignant neoplasm of breast: Secondary | ICD-10-CM

## 2018-02-07 ENCOUNTER — Encounter (HOSPITAL_COMMUNITY): Payer: Self-pay

## 2018-02-07 ENCOUNTER — Ambulatory Visit (HOSPITAL_COMMUNITY)
Admission: RE | Admit: 2018-02-07 | Discharge: 2018-02-07 | Disposition: A | Discharge status: Home / Self Care | Payer: 59 | Source: Ambulatory Visit | Attending: Physician Assistant | Admitting: Physician Assistant

## 2018-02-07 DIAGNOSIS — Z1231 Encounter for screening mammogram for malignant neoplasm of breast: Secondary | ICD-10-CM | POA: Diagnosis present

## 2018-05-06 ENCOUNTER — Other Ambulatory Visit: Payer: Self-pay | Admitting: Gastroenterology

## 2018-11-03 DIAGNOSIS — G4733 Obstructive sleep apnea (adult) (pediatric): Secondary | ICD-10-CM | POA: Diagnosis not present

## 2018-12-04 DIAGNOSIS — G4733 Obstructive sleep apnea (adult) (pediatric): Secondary | ICD-10-CM | POA: Diagnosis not present

## 2018-12-05 MED FILL — OMEPRAZOLE 40 MG CPDR: 40 | 30 days supply | Qty: 60 | Fill #0

## 2018-12-05 MED FILL — GABAPENTIN 300 MG CAPSULE: 300 | 30 days supply | Qty: 30 | Fill #0 | Status: TO

## 2018-12-06 MED FILL — ZOLPIDEM TARTRATE 10 MG TAB: 10 | 30 days supply | Qty: 30 | Fill #0

## 2018-12-07 ENCOUNTER — Encounter (INDEPENDENT_AMBULATORY_CARE_PROVIDER_SITE_OTHER): Payer: Managed Care, Other (non HMO) | Admitting: Ophthalmology

## 2018-12-11 NOTE — Progress Notes (Signed)
[facility] Note  12/12/2018     CHIEF COMPLAINT Patient presents for Retina Evaluation   HISTORY OF PRESENT ILLNESS: Holly Krueger is a 53 y.o. female who presents to the clinic today for:   HPI    Retina Evaluation    In right eye.  This started 2 months ago.  Duration of 2 months.  Associated Symptoms Floaters.  Context:  distance vision, mid-range vision and near vision.  Treatments tried include no treatments.  I, the attending physician,  performed the HPI with the patient and updated documentation appropriately.          Comments    53 y/o female pt referred by Dr. Jorja Loa for eval of CRVO OD.  Saw Dr. Jorja Loa in December of 2019.  VA good OU cc, but feels VA OD is getting gradually blurrier since last visit w/Dr. Jorja Loa.  Denies pain, flashes, but has a few floaters OU.  No gtts.       Last edited by Bernarda Caffey, MD on 12/12/2018  2:19 PM. (History)    pt states she saw Dr. Jorja Loa back in December 2019 for routine eye exam, she states Dr. Jorja Loa took optos pictures and said she had "spots" in the back of her eye, she states she has worn glasses/contact for years, she states she did monovisoin with contacts for awhile, she states she does not seem to be having any problems with her vision right now, pt states she has high blood pressure  Referring physician: Madelin Headings, DO [address] [place], [place] [address]  HISTORICAL INFORMATION:   Selected notes from the MEDICAL RECORD NUMBER Referred by Dr. Madelin Headings for concern of CRVO OD LEE: 12.20.19 (M. Cotter) [BCVA: OD: 20/25+ OS: 20/20-] Ocular Hx- PMH-HTN    CURRENT MEDICATIONS: No current outpatient medications on file. (Ophthalmic Drugs)   No current facility-administered medications for this visit.  (Ophthalmic Drugs)   Current Outpatient Medications (Other)  Medication Sig  . albuterol (PROVENTIL HFA;VENTOLIN HFA) 108 (90 Base) MCG/ACT inhaler INHALE 1 TO 2 PUFFS INTO LUNGS  EVERY 4 HOURS AS NEEDED  . benzonatate (TESSALON) 100 MG capsule TAKE 1 CAPSULE BY MOUTH THREE TIMES DAILY AS NEEDED  . diphenhydramine-acetaminophen (TYLENOL PM) 25-500 MG TABS tablet Take 1-2 tablets by mouth at bedtime as needed (sleep).  . gabapentin (NEURONTIN) 300 MG capsule   . hydrochlorothiazide (HYDRODIURIL) 25 MG tablet Take 25 mg by mouth daily.  . hydrOXYzine (VISTARIL) 25 MG capsule   . metoprolol succinate (TOPROL-XL) 25 MG 24 hr tablet Take 25 mg by mouth daily.  Marland Kitchen omeprazole (PRILOSEC) 20 MG capsule TAKE 1 CAPSULE BY MOUTH ONCE DAILY 30 MINS PRIOR  TO  BREAKFAST  ON  SAT  AND  SUN  . omeprazole (PRILOSEC) 40 MG capsule   . ondansetron (ZOFRAN) 4 MG tablet TAKE 1 TABLET BY MOUTH EVERY 6 HOURS AS NEEDED FOR 3 DAYS  . oseltamivir (TAMIFLU) 75 MG capsule Take 75 mg by mouth daily.  Marland Kitchen oxyCODONE-acetaminophen (PERCOCET/ROXICET) 5-325 MG tablet Take 1 tablet by mouth every 4 (four) hours as needed.  . ranitidine (ZANTAC) 150 MG tablet Take 1 tablet (150 mg total) by mouth 2 (two) times daily.  Marland Kitchen rOPINIRole (REQUIP) 1 MG tablet Take 1 mg by mouth at bedtime.  Marland Kitchen VIRTUSSIN A/C 100-10 MG/5ML syrup TAKE 10 ML BY MOUTH EVERY 4 HOURS AS NEEDED  . zolpidem (AMBIEN) 10 MG tablet Take 1 tablet (10 mg total) by mouth  at bedtime as needed for sleep. Up 5 times weekly   No current facility-administered medications for this visit.  (Other)      REVIEW OF SYSTEMS: ROS    Positive for: Eyes   Negative for: Constitutional, Gastrointestinal, Neurological, Skin, Genitourinary, Musculoskeletal, HENT, Endocrine, Cardiovascular, Respiratory, Psychiatric, Allergic/Imm, Heme/Lymph   Last edited by Matthew Folks, COA on 12/12/2018  1:35 PM. (History)       ALLERGIES Allergies  Allergen Reactions  . Codeine Hives  . Hydrocodone Hives    PAST MEDICAL HISTORY Past Medical History:  Diagnosis Date  . Endometriosis   . Headache(784.0)   . Kidney stone    Past Surgical History:  Procedure  Laterality Date  . ABDOMINAL HYSTERECTOMY    . APPENDECTOMY    . COLONOSCOPY N/A 10/02/2015   Procedure: COLONOSCOPY;  Surgeon: Danie Binder, MD;  [facility];  Service: Endoscopy;  Laterality: N/A;  830   . FOOT SURGERY      FAMILY HISTORY Family History  Problem Relation Age of Onset  . Hyperlipidemia Mother   . Hypertension Father     SOCIAL HISTORY Social History   Tobacco Use  . Smoking status: Never Smoker  . Smokeless tobacco: Never Used  Substance Use Topics  . Alcohol use: No  . Drug use: No         OPHTHALMIC EXAM:  Base Eye Exam    Visual Acuity (Snellen - Linear)      Right Left   Dist cc 20/20 -2 20/20 -   Correction:  Glasses       Tonometry (Tonopen, 1:37 PM)      Right Left   Pressure 10 12       Pupils      Dark Light Shape React APD   Right 4 3 Round Brisk None   Left 4 3 Round Brisk None       Visual Fields (Counting fingers)      Left Right    Full Full       Extraocular Movement      Right Left    Full, Ortho Full, Ortho       Neuro/Psych    Oriented x3:  Yes   Mood/Affect:  Normal       Dilation    Both eyes:  1.0% Mydriacyl, 2.5% Phenylephrine @ 1:37 PM        Slit Lamp and Fundus Exam    Slit Lamp Exam      Right Left   Lids/Lashes mild Meibomian gland dysfunction mild Meibomian gland dysfunction   Conjunctiva/Sclera White and quiet White and quiet   Cornea Clear Clear   Anterior Chamber deep and clear deep and clear   Iris Round and dilated Round and dilated   Lens 1+ Nuclear sclerosis, 1+ Cortical cataract 1+ Nuclear sclerosis, 1+ Cortical cataract   Vitreous Vitreous syneresis Vitreous syneresis       Fundus Exam      Right Left   Disc Pink and Sharp, focal hyperema at 0300 Pink and Sharp   C/D Ratio 0.2 0.4   Macula Good foveal reflex, scattered MA/punctate IRH temporal macula Good foveal reflex, mild Retinal pigment epithelial mottling, No heme or edema   Vessels Vascular attenuation,  Tortuous Vascular attenuation, AV crossing changes   Periphery Attached, scattered IRH  Attached           Refraction    Wearing Rx      Sphere Cylinder Axis  Right -5.00 +2.75 022   Left -4.75 +3.00 162   Age:  55yr   Type:  SVL       Manifest Refraction      Sphere Cylinder Axis Dist VA   Right -4.75 +2.75 022 20/20-   Left -5.25 +3.25 162 20/20-          IMAGING AND PROCEDURES  Imaging and Procedures for @TODAY @  OCT, Retina - OU - Both Eyes       Right Eye Quality was good. Central Foveal Thickness: 283. Progression has no prior data. Findings include normal foveal contour, no SRF, no IRF, vitreomacular adhesion .   Left Eye Quality was good. Central Foveal Thickness: 265. Progression has no prior data. Findings include normal foveal contour, no SRF, no IRF.   Notes *Images captured and stored on drive  Diagnosis / Impression:  NFP, no IRF/SRF OU   Clinical management:  See below  Abbreviations: NFP - Normal foveal profile. CME - cystoid macular edema. PED - pigment epithelial detachment. IRF - intraretinal fluid. SRF - subretinal fluid. EZ - ellipsoid zone. ERM - epiretinal membrane. ORA - outer retinal atrophy. ORT - outer retinal tubulation. SRHM - subretinal hyper-reflective material        Fluorescein Angiography Optos (Transit OD)       Right Eye   Progression has no prior data. Early phase findings include normal observations. Mid/Late phase findings include leakage, staining (Late staining of disc Mild, focal hyperfluorescent leakage from peripheral veins temporally).   Left Eye   Progression has no prior data. Early phase findings include normal observations. Mid/Late phase findings include normal observations.   Notes **Images stored on drive**  Impression: OD: Late staining of disc; Mild, focal hyperfluorescent leakage from peripheral veins temporally OS: Normal study                 ASSESSMENT/PLAN:    ICD-10-CM   1.  Retinal hemorrhage of right eye H35.61   2. Essential hypertension I10   3. Hypertensive retinopathy of both eyes H35.033 Fluorescein Angiography Optos (Transit OD)  4. Retinal edema H35.81 OCT, Retina - OU - Both Eyes  5. Nuclear sclerosis of both eyes H25.13     1-3. Hypertensive retinopathy OU (OD > OS) - OD with tortuous vessels and scattered punctate IRH -- OS without (normal) - ?impending CRVO - FA shows late staining of disc and focal hyperfluorescent leakage from distal temporal venules -- ?focal inflammation / vasculitis - BCVA remains 20/20 OU - discussed importance of tight BP control - monitor for now - f/u in 4-6 wks with DFE, OCT, possible repeat FA  4. No retinal edema on exam or OCT  5. Nuclear sclerosis OU - The symptoms of cataract, surgical options, and treatments and risks were discussed with patient. - discussed diagnosis and progression - not yet visually significant - monitor for now   Ophthalmic Meds Ordered this visit:  No orders of the defined types were placed in this encounter.      Return for 4-6 wks - f/u impending CRVO OD; Dilated Exam, OCT.  There are no Patient Instructions on file for this visit.   Explained the diagnoses, plan, and follow up with the patient and they expressed understanding.  Patient expressed understanding of the importance of proper follow up care.   This document serves as a record of services personally performed by Gardiner Sleeper, MD, PhD. It was created on their behalf by Ernest Mallick, OA, an ophthalmic  assistant. The creation of this record is the provider's dictation and/or activities during the visit.    Electronically signed by: Ernest Mallick, OA  02.25.2020 5:42 PM    Gardiner Sleeper, M.D., Ph.D. Diseases & Surgery of the Retina and Vitreous [facility]   I have reviewed the above documentation for accuracy and completeness, and I agree with the above. Gardiner Sleeper, M.D., Ph.D.  12/12/18 5:42 PM     Abbreviations: M myopia (nearsighted); A astigmatism; H hyperopia (farsighted); P presbyopia; Mrx spectacle prescription;  CTL contact lenses; OD right eye; OS left eye; OU both eyes  XT exotropia; ET esotropia; PEK punctate epithelial keratitis; PEE punctate epithelial erosions; DES dry eye syndrome; MGD meibomian gland dysfunction; ATs artificial tears; PFAT's preservative free artificial tears; [place] nuclear sclerotic cataract; PSC posterior subcapsular cataract; ERM epi-retinal membrane; PVD posterior vitreous detachment; RD retinal detachment; DM diabetes mellitus; DR diabetic retinopathy; NPDR non-proliferative diabetic retinopathy; PDR proliferative diabetic retinopathy; CSME clinically significant macular edema; DME diabetic macular edema; dbh dot blot hemorrhages; CWS cotton wool spot; POAG primary open angle glaucoma; C/D cup-to-disc ratio; HVF humphrey visual field; GVF goldmann visual field; OCT optical coherence tomography; IOP intraocular pressure; BRVO Branch retinal vein occlusion; CRVO central retinal vein occlusion; CRAO central retinal artery occlusion; BRAO branch retinal artery occlusion; RT retinal tear; SB scleral buckle; PPV pars plana vitrectomy; VH Vitreous hemorrhage; PRP panretinal laser photocoagulation; IVK intravitreal kenalog; VMT vitreomacular traction; MH Macular hole;  NVD neovascularization of the disc; NVE neovascularization elsewhere; AREDS age related eye disease study; ARMD age related macular degeneration; POAG primary open angle glaucoma; EBMD epithelial/anterior basement membrane dystrophy; ACIOL anterior chamber intraocular lens; IOL intraocular lens; PCIOL posterior chamber intraocular lens; Phaco/IOL phacoemulsification with intraocular lens placement; [place] photorefractive keratectomy; LASIK laser assisted in situ keratomileusis; HTN hypertension; DM diabetes mellitus; COPD chronic obstructive pulmonary disease

## 2018-12-12 ENCOUNTER — Encounter (INDEPENDENT_AMBULATORY_CARE_PROVIDER_SITE_OTHER): Payer: Self-pay | Admitting: Ophthalmology

## 2018-12-12 ENCOUNTER — Ambulatory Visit (INDEPENDENT_AMBULATORY_CARE_PROVIDER_SITE_OTHER): Payer: 59 | Admitting: Ophthalmology

## 2018-12-12 DIAGNOSIS — I10 Essential (primary) hypertension: Secondary | ICD-10-CM

## 2018-12-12 DIAGNOSIS — H35033 Hypertensive retinopathy, bilateral: Secondary | ICD-10-CM | POA: Diagnosis not present

## 2018-12-12 DIAGNOSIS — H2513 Age-related nuclear cataract, bilateral: Secondary | ICD-10-CM | POA: Diagnosis not present

## 2018-12-12 DIAGNOSIS — H3581 Retinal edema: Secondary | ICD-10-CM

## 2018-12-12 DIAGNOSIS — H3561 Retinal hemorrhage, right eye: Secondary | ICD-10-CM | POA: Diagnosis not present

## 2018-12-31 DIAGNOSIS — Z6841 Body Mass Index (BMI) 40.0 and over, adult: Secondary | ICD-10-CM | POA: Diagnosis not present

## 2018-12-31 DIAGNOSIS — F5101 Primary insomnia: Secondary | ICD-10-CM | POA: Diagnosis not present

## 2018-12-31 DIAGNOSIS — I10 Essential (primary) hypertension: Secondary | ICD-10-CM | POA: Diagnosis not present

## 2018-12-31 DIAGNOSIS — Z1389 Encounter for screening for other disorder: Secondary | ICD-10-CM | POA: Diagnosis not present

## 2019-01-01 MED FILL — HYDROCHLOROTHIAZIDE 25 MG T: 25 | 30 days supply | Qty: 30 | Fill #0

## 2019-01-02 DIAGNOSIS — G4733 Obstructive sleep apnea (adult) (pediatric): Secondary | ICD-10-CM | POA: Diagnosis not present

## 2019-01-09 ENCOUNTER — Encounter (INDEPENDENT_AMBULATORY_CARE_PROVIDER_SITE_OTHER): Payer: 59 | Admitting: Ophthalmology

## 2019-01-31 MED FILL — QUETIAPINE FUMARATE 50 MG T: 50 | 30 days supply | Qty: 30 | Fill #0

## 2019-01-31 NOTE — Progress Notes (Signed)
[facility] Note  02/01/2019     CHIEF COMPLAINT Patient presents for Retina Follow Up   HISTORY OF PRESENT ILLNESS: Holly Krueger is a 53 y.o. female who presents to the clinic today for:   HPI    Retina Follow Up    Patient presents with  Other.  In right eye.  This started 2 months ago.  Severity is mild.  Since onset it is stable.  I, the attending physician,  performed the HPI with the patient and updated documentation appropriately.          Comments    F/U retina hem. OD. Patient states her vision is "good", occasional floaters, denies flashes and ocular pain. Denies gtt's       Last edited by Bernarda Caffey, MD on 02/01/2019 12:58 PM. (History)    pt states   Referring physician: Sharilyn Sites, MD [address] [place], [place] [address]  HISTORICAL INFORMATION:   Selected notes from the MEDICAL RECORD NUMBER Referred by Dr. Madelin Headings for concern of CRVO OD LEE: 12.20.19 (M. Cotter) [BCVA: OD: 20/25+ OS: 20/20-] Ocular Hx- PMH-HTN    CURRENT MEDICATIONS: No current outpatient medications on file. (Ophthalmic Drugs)   No current facility-administered medications for this visit.  (Ophthalmic Drugs)   Current Outpatient Medications (Other)  Medication Sig  . albuterol (PROVENTIL HFA;VENTOLIN HFA) 108 (90 Base) MCG/ACT inhaler INHALE 1 TO 2 PUFFS INTO LUNGS EVERY 4 HOURS AS NEEDED  . benzonatate (TESSALON) 100 MG capsule TAKE 1 CAPSULE BY MOUTH THREE TIMES DAILY AS NEEDED  . diphenhydramine-acetaminophen (TYLENOL PM) 25-500 MG TABS tablet Take 1-2 tablets by mouth at bedtime as needed (sleep).  . gabapentin (NEURONTIN) 300 MG capsule   . hydrochlorothiazide (HYDRODIURIL) 25 MG tablet Take 25 mg by mouth daily.  . hydrOXYzine (VISTARIL) 25 MG capsule   . metoprolol succinate (TOPROL-XL) 25 MG 24 hr tablet Take 25 mg by mouth daily.  Marland Kitchen omeprazole (PRILOSEC) 20 MG capsule TAKE 1 CAPSULE BY MOUTH ONCE DAILY 30 MINS PRIOR  TO   BREAKFAST  ON  SAT  AND  SUN  . omeprazole (PRILOSEC) 40 MG capsule   . ondansetron (ZOFRAN) 4 MG tablet TAKE 1 TABLET BY MOUTH EVERY 6 HOURS AS NEEDED FOR 3 DAYS  . oseltamivir (TAMIFLU) 75 MG capsule Take 75 mg by mouth daily.  Marland Kitchen oxyCODONE-acetaminophen (PERCOCET/ROXICET) 5-325 MG tablet Take 1 tablet by mouth every 4 (four) hours as needed.  . ranitidine (ZANTAC) 150 MG tablet Take 1 tablet (150 mg total) by mouth 2 (two) times daily.  Marland Kitchen rOPINIRole (REQUIP) 1 MG tablet Take 1 mg by mouth at bedtime.  Marland Kitchen VIRTUSSIN A/C 100-10 MG/5ML syrup TAKE 10 ML BY MOUTH EVERY 4 HOURS AS NEEDED  . zolpidem (AMBIEN) 10 MG tablet Take 1 tablet (10 mg total) by mouth at bedtime as needed for sleep. Up 5 times weekly   No current facility-administered medications for this visit.  (Other)      REVIEW OF SYSTEMS: ROS    Positive for: Eyes   Negative for: Constitutional, Gastrointestinal, Neurological, Skin, Genitourinary, Musculoskeletal, HENT, Endocrine, Cardiovascular, Respiratory, Psychiatric, Allergic/Imm, Heme/Lymph   Last edited by Zenovia Jordan, LPN on 02/16/8881 80:03 PM. (History)       ALLERGIES Allergies  Allergen Reactions  . Codeine Hives  . Hydrocodone Hives    PAST MEDICAL HISTORY Past Medical History:  Diagnosis Date  . Endometriosis   . Headache(784.0)   . Kidney stone  Past Surgical History:  Procedure Laterality Date  . ABDOMINAL HYSTERECTOMY    . APPENDECTOMY    . COLONOSCOPY N/A 10/02/2015   Procedure: COLONOSCOPY;  Surgeon: Danie Binder, MD;  Location: [facility];  Service: Endoscopy;  Laterality: N/A;  830   . FOOT SURGERY      FAMILY HISTORY Family History  Problem Relation Age of Onset  . Hyperlipidemia Mother   . Hypertension Father     SOCIAL HISTORY Social History   Tobacco Use  . Smoking status: Never Smoker  . Smokeless tobacco: Never Used  Substance Use Topics  . Alcohol use: No  . Drug use: No         OPHTHALMIC EXAM:  Base  Eye Exam    Visual Acuity (Snellen - Linear)      Right Left   Dist cc 20/25 -2 20/20 -2   Dist ph cc 20/25 -1 NI   Correction:  Glasses       Tonometry (Tonopen, 12:42 PM)      Right Left   Pressure 10 12       Pupils      Dark Light Shape React APD   Right 4 3 Round Brisk None   Left 4 3 Round Brisk None       Visual Fields (Counting fingers)      Left Right    Full Full       Extraocular Movement      Right Left    Full, Ortho Full, Ortho       Neuro/Psych    Oriented x3:  Yes   Mood/Affect:  Normal       Dilation    Both eyes:  1.0% Mydriacyl @ 12:37 PM        Slit Lamp and Fundus Exam    Slit Lamp Exam      Right Left   Lids/Lashes mild Meibomian gland dysfunction mild Meibomian gland dysfunction   Conjunctiva/Sclera White and quiet White and quiet   Cornea Clear Clear   Anterior Chamber deep and clear deep and clear   Iris Round and dilated Round and dilated   Lens 1+ Nuclear sclerosis, 1+ Cortical cataract 1+ Nuclear sclerosis, 1+ Cortical cataract   Vitreous Vitreous syneresis Vitreous syneresis       Fundus Exam      Right Left   Disc Pink and Sharp, focal hyperema and vascular loops at 0300 Pink and Sharp   C/D Ratio 0.2 0.3   Macula Flat, Good foveal reflex, scattered MA/punctate IRH Good foveal reflex, No heme or edema   Vessels Vascular attenuation, Tortuous Vascular attenuation, tortuous, AV crossing changes   Periphery Attached, rare, scattered IRH  Attached             IMAGING AND PROCEDURES  Imaging and Procedures for _0 @  OCT, Retina - OU - Both Eyes       Right Eye Quality was good. Central Foveal Thickness: 271. Progression has been stable. Findings include normal foveal contour, no SRF, no IRF, vitreomacular adhesion .   Left Eye Quality was good. Central Foveal Thickness: 264. Progression has been stable. Findings include normal foveal contour, no SRF, no IRF.   Notes *Images captured and stored on  drive  Diagnosis / Impression:  NFP, no IRF/SRF OU   Clinical management:  See below  Abbreviations: NFP - Normal foveal profile. CME - cystoid macular edema. PED - pigment epithelial detachment. IRF - intraretinal fluid. SRF - subretinal  fluid. EZ - ellipsoid zone. ERM - epiretinal membrane. ORA - outer retinal atrophy. ORT - outer retinal tubulation. SRHM - subretinal hyper-reflective material        Fluorescein Angiography Optos (Transit OD)       Right Eye   Progression has been stable. Early phase findings include normal observations. Mid/Late phase findings include leakage, staining (Late staining of disc Mild, focal hyperfluorescent leakage from peripheral veins temporally).   Left Eye   Progression has been stable. Early phase findings include normal observations. Mid/Late phase findings include normal observations.   Notes **Images stored on drive**  Impression: OD: Late staining of disc; Mild, focal hyperfluorescent leakage from peripheral veins temporally, late OS: mild, focal hyperfluorescent leakage from peripheral veins nasally late                 ASSESSMENT/PLAN:    ICD-10-CM   1. Retinal hemorrhage of right eye H35.61 Fluorescein Angiography Optos (Transit OD)  2. Essential hypertension I10   3. Hypertensive retinopathy of both eyes H35.033 Fluorescein Angiography Optos (Transit OD)  4. 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 Retinal edema H35.81 OCT, Retina - OU - Both Eyes  6. Nuclear sclerosis of both eyes H25.13     1-4. Hypertensive retinopathy OU (OD > OS) - OD with tortuous vessels and scattered punctate IRH -- OS without heme (normal) - ?impending CRVO - FA shows late staining of disc and focal hyperfluorescent leakage from distal temporal venules OD -- ?focal inflammation / vasculitis - BCVA OD: 20/25- OS: 20/20  - discussed importance of tight BP control - due to persistent, focal, peripheral vascular leakage,  recommend evaluation with Dr. Dwana Melena, Uveitis/Vitreoretinal specialist for further evaluation and management - f/u here in 3-4 months with DFE, OCT  5. No retinal edema on exam or OCT  6. Nuclear sclerosis OU - The symptoms of cataract, surgical options, and treatments and risks were discussed with patient. - discussed diagnosis and progression - not yet visually significant - monitor for now   Ophthalmic Meds Ordered this visit:  No orders of the defined types were placed in this encounter.      Return for f/u 3-4 months, HTN ret OU, DFE, OCT.  There are no Patient Instructions on file for this visit.   Explained the diagnoses, plan, and follow up with the patient and they expressed understanding.  Patient expressed understanding of the importance of proper follow up care.   This document serves as a record of services personally performed by Gardiner Sleeper, MD, PhD. It was created on their behalf by Ernest Mallick, OA, an ophthalmic assistant. The creation of this record is the provider's dictation and/or activities during the visit.    Electronically signed by: Ernest Mallick, OA  04.16.2020 3:18 PM    Gardiner Sleeper, M.D., Ph.D. Diseases & Surgery of the Retina and Vitreous [facility]  I have reviewed the above documentation for accuracy and completeness, and I agree with the above. Gardiner Sleeper, M.D., Ph.D. 02/01/19 3:18 PM    Abbreviations: M myopia (nearsighted); A astigmatism; H hyperopia (farsighted); P presbyopia; Mrx spectacle prescription;  CTL contact lenses; OD right eye; OS left eye; OU both eyes  XT exotropia; ET esotropia; PEK punctate epithelial keratitis; PEE punctate epithelial erosions; DES dry eye syndrome; MGD meibomian gland dysfunction; ATs artificial tears; PFAT's preservative free artificial tears; [place] nuclear sclerotic cataract; PSC posterior subcapsular cataract; ERM epi-retinal membrane; PVD posterior  vitreous  detachment; RD retinal detachment; DM diabetes mellitus; DR diabetic retinopathy; NPDR non-proliferative diabetic retinopathy; PDR proliferative diabetic retinopathy; CSME clinically significant macular edema; DME diabetic macular edema; dbh dot blot hemorrhages; CWS cotton wool spot; POAG primary open angle glaucoma; C/D cup-to-disc ratio; HVF humphrey visual field; GVF goldmann visual field; OCT optical coherence tomography; IOP intraocular pressure; BRVO Branch retinal vein occlusion; CRVO central retinal vein occlusion; CRAO central retinal artery occlusion; BRAO branch retinal artery occlusion; RT retinal tear; SB scleral buckle; PPV pars plana vitrectomy; VH Vitreous hemorrhage; PRP panretinal laser photocoagulation; IVK intravitreal kenalog; VMT vitreomacular traction; MH Macular hole;  NVD neovascularization of the disc; NVE neovascularization elsewhere; AREDS age related eye disease study; ARMD age related macular degeneration; POAG primary open angle glaucoma; EBMD epithelial/anterior basement membrane dystrophy; ACIOL anterior chamber intraocular lens; IOL intraocular lens; PCIOL posterior chamber intraocular lens; Phaco/IOL phacoemulsification with intraocular lens placement; [place] photorefractive keratectomy; LASIK laser assisted in situ keratomileusis; HTN hypertension; DM diabetes mellitus; COPD chronic obstructive pulmonary disease

## 2019-02-01 ENCOUNTER — Other Ambulatory Visit: Payer: Self-pay

## 2019-02-01 ENCOUNTER — Encounter (INDEPENDENT_AMBULATORY_CARE_PROVIDER_SITE_OTHER): Payer: 59 | Admitting: Ophthalmology

## 2019-02-01 ENCOUNTER — Ambulatory Visit (INDEPENDENT_AMBULATORY_CARE_PROVIDER_SITE_OTHER): Payer: 59 | Admitting: Ophthalmology

## 2019-02-01 ENCOUNTER — Encounter (INDEPENDENT_AMBULATORY_CARE_PROVIDER_SITE_OTHER): Payer: Self-pay | Admitting: Ophthalmology

## 2019-02-01 DIAGNOSIS — H35033 Hypertensive retinopathy, bilateral: Secondary | ICD-10-CM | POA: Diagnosis not present

## 2019-02-01 DIAGNOSIS — H2513 Age-related nuclear cataract, bilateral: Secondary | ICD-10-CM | POA: Diagnosis not present

## 2019-02-01 DIAGNOSIS — H30003 Unspecified focal chorioretinal inflammation, bilateral: Secondary | ICD-10-CM | POA: Diagnosis not present

## 2019-02-01 DIAGNOSIS — H3561 Retinal hemorrhage, right eye: Secondary | ICD-10-CM

## 2019-02-01 DIAGNOSIS — I10 Essential (primary) hypertension: Secondary | ICD-10-CM | POA: Diagnosis not present

## 2019-02-01 DIAGNOSIS — H3581 Retinal edema: Secondary | ICD-10-CM

## 2019-02-01 MED FILL — GABAPENTIN 300 MG CAPSULE: 300 | 30 days supply | Qty: 30 | Fill #0

## 2019-02-02 DIAGNOSIS — G4733 Obstructive sleep apnea (adult) (pediatric): Secondary | ICD-10-CM | POA: Diagnosis not present

## 2019-02-07 DIAGNOSIS — H2513 Age-related nuclear cataract, bilateral: Secondary | ICD-10-CM | POA: Diagnosis not present

## 2019-02-07 DIAGNOSIS — H2511 Age-related nuclear cataract, right eye: Secondary | ICD-10-CM | POA: Diagnosis not present

## 2019-02-07 DIAGNOSIS — H30033 Focal chorioretinal inflammation, peripheral, bilateral: Secondary | ICD-10-CM | POA: Diagnosis not present

## 2019-02-07 DIAGNOSIS — H35063 Retinal vasculitis, bilateral: Secondary | ICD-10-CM | POA: Diagnosis not present

## 2019-02-07 DIAGNOSIS — H35033 Hypertensive retinopathy, bilateral: Secondary | ICD-10-CM | POA: Diagnosis not present

## 2019-02-07 DIAGNOSIS — H3581 Retinal edema: Secondary | ICD-10-CM | POA: Diagnosis not present

## 2019-02-11 DIAGNOSIS — H30033 Focal chorioretinal inflammation, peripheral, bilateral: Secondary | ICD-10-CM | POA: Diagnosis not present

## 2019-03-01 DIAGNOSIS — H35063 Retinal vasculitis, bilateral: Secondary | ICD-10-CM | POA: Diagnosis not present

## 2019-03-01 DIAGNOSIS — H2511 Age-related nuclear cataract, right eye: Secondary | ICD-10-CM | POA: Diagnosis not present

## 2019-03-01 DIAGNOSIS — H3581 Retinal edema: Secondary | ICD-10-CM | POA: Diagnosis not present

## 2019-03-01 DIAGNOSIS — H30033 Focal chorioretinal inflammation, peripheral, bilateral: Secondary | ICD-10-CM | POA: Diagnosis not present

## 2019-03-01 DIAGNOSIS — H35033 Hypertensive retinopathy, bilateral: Secondary | ICD-10-CM | POA: Diagnosis not present

## 2019-03-03 MED FILL — QUETIAPINE FUMARATE 50 MG T: 50 | 30 days supply | Qty: 30 | Fill #1

## 2019-03-04 MED FILL — GABAPENTIN 300 MG CAPSULE: 300 | 30 days supply | Qty: 30 | Fill #1

## 2019-03-06 MED FILL — OMEPRAZOLE 40 MG CPDR: 40 | 30 days supply | Qty: 60 | Fill #0

## 2019-03-26 ENCOUNTER — Other Ambulatory Visit (HOSPITAL_COMMUNITY): Payer: Self-pay | Admitting: Family Medicine

## 2019-03-26 DIAGNOSIS — Z1231 Encounter for screening mammogram for malignant neoplasm of breast: Secondary | ICD-10-CM

## 2019-03-28 ENCOUNTER — Other Ambulatory Visit: Payer: Self-pay

## 2019-03-28 ENCOUNTER — Ambulatory Visit (HOSPITAL_COMMUNITY)
Admission: RE | Admit: 2019-03-28 | Discharge: 2019-03-28 | Disposition: A | Discharge status: Home / Self Care | Payer: 59 | Source: Ambulatory Visit | Attending: Family Medicine | Admitting: Family Medicine

## 2019-03-28 DIAGNOSIS — Z1231 Encounter for screening mammogram for malignant neoplasm of breast: Secondary | ICD-10-CM | POA: Insufficient documentation

## 2019-03-29 MED FILL — MONTELUKAST SOD 10 MG TAB: 10 | 90 days supply | Qty: 90 | Fill #0

## 2019-04-01 MED FILL — OMEPRAZOLE 20 MG CPDR: 20 | 30 days supply | Qty: 30 | Fill #0

## 2019-04-01 MED FILL — azaTHIOprine 50 MG TABS: 50 | 90 days supply | Qty: 90 | Fill #0

## 2019-04-01 MED FILL — HYDROCHLOROTHIAZIDE 25 MG T: 25 | 90 days supply | Qty: 90 | Fill #0

## 2019-04-01 MED FILL — QUETIAPINE FUMARATE 50 MG T: 50 | 30 days supply | Qty: 30 | Fill #0

## 2019-04-16 ENCOUNTER — Other Ambulatory Visit (HOSPITAL_COMMUNITY)
Admission: RE | Admit: 2019-04-16 | Discharge: 2019-04-16 | Disposition: A | Discharge status: Home / Self Care | Payer: 59 | Source: Ambulatory Visit | Attending: Ophthalmology | Admitting: Ophthalmology

## 2019-04-16 DIAGNOSIS — H3581 Retinal edema: Secondary | ICD-10-CM | POA: Diagnosis not present

## 2019-04-16 DIAGNOSIS — H35063 Retinal vasculitis, bilateral: Secondary | ICD-10-CM | POA: Diagnosis not present

## 2019-04-16 DIAGNOSIS — H30033 Focal chorioretinal inflammation, peripheral, bilateral: Secondary | ICD-10-CM | POA: Insufficient documentation

## 2019-04-16 DIAGNOSIS — H35033 Hypertensive retinopathy, bilateral: Secondary | ICD-10-CM | POA: Diagnosis not present

## 2019-04-16 DIAGNOSIS — Z5181 Encounter for therapeutic drug level monitoring: Secondary | ICD-10-CM | POA: Diagnosis not present

## 2019-04-16 DIAGNOSIS — H2511 Age-related nuclear cataract, right eye: Secondary | ICD-10-CM | POA: Diagnosis not present

## 2019-04-16 LAB — CBC WITH DIFFERENTIAL/PLATELET
Abs Immature Granulocytes: 0.01 10*3/uL (ref 0.00–0.07)
Basophils Absolute: 0.1 10*3/uL (ref 0.0–0.1)
Basophils Relative: 1 %
Eosinophils Absolute: 0.3 10*3/uL (ref 0.0–0.5)
Eosinophils Relative: 4 %
HCT: 36.2 % (ref 36.0–46.0)
Hemoglobin: 11.4 g/dL — ABNORMAL LOW (ref 12.0–15.0)
Immature Granulocytes: 0 %
Lymphocytes Relative: 29 %
Lymphs Abs: 1.9 10*3/uL (ref 0.7–4.0)
MCH: 26.5 pg (ref 26.0–34.0)
MCHC: 31.5 g/dL (ref 30.0–36.0)
MCV: 84 fL (ref 80.0–100.0)
Monocytes Absolute: 0.5 10*3/uL (ref 0.1–1.0)
Monocytes Relative: 7 %
Neutro Abs: 3.9 10*3/uL (ref 1.7–7.7)
Neutrophils Relative %: 59 %
Platelets: 380 10*3/uL (ref 150–400)
RBC: 4.31 MIL/uL (ref 3.87–5.11)
RDW: 16.5 % — ABNORMAL HIGH (ref 11.5–15.5)
WBC: 6.7 10*3/uL (ref 4.0–10.5)
nRBC: 0 % (ref 0.0–0.2)

## 2019-04-16 LAB — COMPREHENSIVE METABOLIC PANEL
ALT: 17 U/L (ref 0–44)
AST: 21 U/L (ref 15–41)
Albumin: 4.1 g/dL (ref 3.5–5.0)
Alkaline Phosphatase: 93 U/L (ref 38–126)
Anion gap: 9 (ref 5–15)
BUN: 12 mg/dL (ref 6–20)
CO2: 25 mmol/L (ref 22–32)
Calcium: 8.9 mg/dL (ref 8.9–10.3)
Chloride: 104 mmol/L (ref 98–111)
Creatinine, Ser: 0.92 mg/dL (ref 0.44–1.00)
GFR calc Af Amer: >60 mL/min (ref >60)
GFR calc non Af Amer: >60 mL/min (ref >60)
Glucose, Bld: 98 mg/dL (ref 70–99)
Potassium: 3.4 mmol/L — ABNORMAL LOW (ref 3.5–5.1)
Sodium: 138 mmol/L (ref 135–145)
Total Bilirubin: 0.6 mg/dL (ref 0.3–1.2)
Total Protein: 7.1 g/dL (ref 6.5–8.1)

## 2019-04-22 MED FILL — GABAPENTIN 300 MG CAPSULE: 300 | 30 days supply | Qty: 30 | Fill #2

## 2019-04-24 ENCOUNTER — Emergency Department (HOSPITAL_COMMUNITY)
Admission: EM | Admit: 2019-04-24 | Discharge: 2019-04-24 | Disposition: A | Discharge status: Home / Self Care | Payer: 59 | Attending: Emergency Medicine | Admitting: Emergency Medicine

## 2019-04-24 ENCOUNTER — Encounter (HOSPITAL_COMMUNITY): Payer: Self-pay

## 2019-04-24 ENCOUNTER — Emergency Department (HOSPITAL_COMMUNITY): Payer: 59

## 2019-04-24 ENCOUNTER — Other Ambulatory Visit: Payer: Self-pay

## 2019-04-24 DIAGNOSIS — R531 Weakness: Secondary | ICD-10-CM | POA: Diagnosis not present

## 2019-04-24 DIAGNOSIS — R0602 Shortness of breath: Secondary | ICD-10-CM | POA: Insufficient documentation

## 2019-04-24 DIAGNOSIS — I480 Paroxysmal atrial fibrillation: Secondary | ICD-10-CM | POA: Diagnosis not present

## 2019-04-24 DIAGNOSIS — Z7901 Long term (current) use of anticoagulants: Secondary | ICD-10-CM | POA: Insufficient documentation

## 2019-04-24 DIAGNOSIS — R000 Tachycardia, unspecified: Secondary | ICD-10-CM | POA: Diagnosis present

## 2019-04-24 DIAGNOSIS — I4891 Unspecified atrial fibrillation: Secondary | ICD-10-CM | POA: Diagnosis not present

## 2019-04-24 DIAGNOSIS — I10 Essential (primary) hypertension: Secondary | ICD-10-CM | POA: Diagnosis not present

## 2019-04-24 DIAGNOSIS — Z79899 Other long term (current) drug therapy: Secondary | ICD-10-CM | POA: Diagnosis not present

## 2019-04-24 HISTORY — DX: Gastro-esophageal reflux disease without esophagitis: K21.9

## 2019-04-24 HISTORY — DX: Essential (primary) hypertension: I10

## 2019-04-24 LAB — CBC WITH DIFFERENTIAL/PLATELET
Abs Immature Granulocytes: 0.02 10*3/uL (ref 0.00–0.07)
Basophils Absolute: 0.1 10*3/uL (ref 0.0–0.1)
Basophils Relative: 1 %
Eosinophils Absolute: 0.2 10*3/uL (ref 0.0–0.5)
Eosinophils Relative: 4 %
HCT: 36 % (ref 36.0–46.0)
Hemoglobin: 11.1 g/dL — ABNORMAL LOW (ref 12.0–15.0)
Immature Granulocytes: 0 %
Lymphocytes Relative: 22 %
Lymphs Abs: 1.1 10*3/uL (ref 0.7–4.0)
MCH: 26.2 pg (ref 26.0–34.0)
MCHC: 30.8 g/dL (ref 30.0–36.0)
MCV: 85.1 fL (ref 80.0–100.0)
Monocytes Absolute: 0.4 10*3/uL (ref 0.1–1.0)
Monocytes Relative: 9 %
Neutro Abs: 3.2 10*3/uL (ref 1.7–7.7)
Neutrophils Relative %: 64 %
Platelets: 336 10*3/uL (ref 150–400)
RBC: 4.23 MIL/uL (ref 3.87–5.11)
RDW: 16.1 % — ABNORMAL HIGH (ref 11.5–15.5)
WBC: 5 10*3/uL (ref 4.0–10.5)
nRBC: 0 % (ref 0.0–0.2)

## 2019-04-24 LAB — COMPREHENSIVE METABOLIC PANEL
ALT: 17 U/L (ref 0–44)
AST: 21 U/L (ref 15–41)
Albumin: 3.9 g/dL (ref 3.5–5.0)
Alkaline Phosphatase: 92 U/L (ref 38–126)
Anion gap: 12 (ref 5–15)
BUN: 7 mg/dL (ref 6–20)
CO2: 25 mmol/L (ref 22–32)
Calcium: 8.9 mg/dL (ref 8.9–10.3)
Chloride: 101 mmol/L (ref 98–111)
Creatinine, Ser: 0.89 mg/dL (ref 0.44–1.00)
GFR calc Af Amer: >60 mL/min (ref >60)
GFR calc non Af Amer: >60 mL/min (ref >60)
Glucose, Bld: 117 mg/dL — ABNORMAL HIGH (ref 70–99)
Potassium: 2.8 mmol/L — ABNORMAL LOW (ref 3.5–5.1)
Sodium: 138 mmol/L (ref 135–145)
Total Bilirubin: 0.6 mg/dL (ref 0.3–1.2)
Total Protein: 6.5 g/dL (ref 6.5–8.1)

## 2019-04-24 LAB — TSH: TSH: 1.265 u[IU]/mL (ref 0.350–4.500)

## 2019-04-24 LAB — MAGNESIUM: Magnesium: 1.9 mg/dL (ref 1.7–2.4)

## 2019-04-24 LAB — TROPONIN I (HIGH SENSITIVITY)
Troponin I (High Sensitivity): 5 ng/L (ref <18)
Troponin I (High Sensitivity): 5 ng/L (ref <18)

## 2019-04-24 MED ORDER — POTASSIUM CHLORIDE CRYS ER 20 MEQ PO TBCR
40.0000 meq | EXTENDED_RELEASE_TABLET | Freq: Once | ORAL | Status: AC
  Administered 2019-04-24: 40 meq via ORAL
  Filled 2019-04-24: qty 2

## 2019-04-24 MED ORDER — APIXABAN 5 MG PO TABS
5.0000 mg | ORAL_TABLET | Freq: Two times a day (BID) | ORAL | Status: DC
  Administered 2019-04-24: 5 mg via ORAL
  Filled 2019-04-24: qty 1

## 2019-04-24 MED ORDER — POTASSIUM CHLORIDE 10 MEQ/100ML IV SOLN
10.0000 meq | Freq: Once | INTRAVENOUS | Status: AC
  Administered 2019-04-24: 10 meq via INTRAVENOUS
  Filled 2019-04-24: qty 100

## 2019-04-24 MED ORDER — METOPROLOL SUCCINATE ER 50 MG PO TB24: 50.0000 mg | ORAL_TABLET | Freq: Every day | ORAL | 0 refills | Status: DC

## 2019-04-24 MED ORDER — METOPROLOL TARTRATE 5 MG/5ML IV SOLN
2.5000 mg | Freq: Once | INTRAVENOUS | Status: AC
  Administered 2019-04-24: 2.5 mg via INTRAVENOUS
  Filled 2019-04-24: qty 5

## 2019-04-24 MED ORDER — POTASSIUM CHLORIDE CRYS ER 20 MEQ PO TBCR: 40.0000 meq | EXTENDED_RELEASE_TABLET | Freq: Every day | ORAL | 0 refills | Status: DC

## 2019-04-24 MED ORDER — APIXABAN 5 MG PO TABS: 5.0000 mg | ORAL_TABLET | Freq: Two times a day (BID) | ORAL | 0 refills | Status: DC

## 2019-04-24 NOTE — Discharge Instructions (Addendum)
Stop taking your HCTZ and follow-up with a cardiologist as planned.  Return sooner if problems

## 2019-04-24 NOTE — Consult Note (Signed)
Cardiology Consultation:   Patient ID: Holly Krueger MRN: [id number]; DOB: May 15, 1966  Admit date: 04/24/2019 Date of Consult: 04/24/2019  Primary Care Provider: Sharilyn Sites, MD Primary Cardiologist: New Primary Electrophysiologist:  None    Patient Profile:   Holly Krueger is a 53 y.o. female with a hx of HTN who is being seen today for the evaluation of tachycardia at the request of Dr Roderic Palau.  History of Present Illness:   Holly Krueger 53 yo female history of HTN who works as a Marine scientist here at [employer] presents with some SOB and dizziness. Occurred while at work, she checked her heart rates by pulse ox at work and noted variable rates and irregularlity. Came to ER, found to be in afib with RVR intermittently, a new finding for the patient.   WBC 5 Hgb 11.1 Plt 336 K 2.8 Cr 0.89  Trop neg EKG afib  Heart Pathway Score:     Past Medical History:  Diagnosis Date  . Endometriosis   . GERD (gastroesophageal reflux disease)   . Headache(784.0)   . Hypertension   . Kidney stone     Past Surgical History:  Procedure Laterality Date  . ABDOMINAL HYSTERECTOMY    . APPENDECTOMY    . COLONOSCOPY N/A 10/02/2015   Procedure: COLONOSCOPY;  Surgeon: Danie Binder, MD;  Location: [facility];  Service: Endoscopy;  Laterality: N/A;  830   . FOOT SURGERY       Inpatient Medications: Scheduled Meds:  Continuous Infusions: . potassium chloride 10 mEq (04/24/19 1121)   PRN Meds:   Allergies:    Allergies  Allergen Reactions  . Codeine Hives  . Hydrocodone Hives    Social History:   Social History   Socioeconomic History  . Marital status: Married    Spouse name: Not on file  . Number of children: Not on file  . Years of education: Not on file  . Highest education level: Not on file  Occupational History  . Not on file  Social Needs  . Financial resource strain: Not on file  . Food insecurity    Worry: Not on file    Inability: Not on file  .  Transportation needs    Medical: Not on file    Non-medical: Not on file  Tobacco Use  . Smoking status: Never Smoker  . Smokeless tobacco: Never Used  Substance and Sexual Activity  . Alcohol use: No  . Drug use: No  . Sexual activity: Yes  Lifestyle  . Physical activity    Days per week: Not on file    Minutes per session: Not on file  . Stress: Not on file  Relationships  . Social Herbalist on phone: Not on file    Gets together: Not on file    Attends religious service: Not on file    Active member of club or organization: Not on file    Attends meetings of clubs or organizations: Not on file    Relationship status: Not on file  . Intimate partner violence    Fear of current or ex partner: Not on file    Emotionally abused: Not on file    Physically abused: Not on file    Forced sexual activity: Not on file  Other Topics Concern  . Not on file  Social History Narrative  . Not on file    Family History:    Family History  Problem Relation Age  of Onset  . Hyperlipidemia Mother   . Hypertension Father      ROS:  Please see the history of present illness.  All other ROS reviewed and negative.     Physical Exam/Data:   Vitals:   04/24/19 1006 04/24/19 1008 04/24/19 1030 04/24/19 1046  BP:  (!) 118/94 99/68   Pulse:  93 (!) 146 81  Resp:   (!) 24 20  Temp:  98.6 F (37 C)    TempSrc:  Oral    SpO2:  99% 92% 95%  Weight: 102.1 kg     Height: 5\' 4"  (1.626 m)      No intake or output data in the 24 hours ending 04/24/19 1124 Last 3 Weights 04/24/2019 11/06/2015 10/02/2015  Weight (lbs) 225 lb 186 lb 180 lb  Weight (kg) 102.059 kg 84.369 kg 81.647 kg     Body mass index is 38.62 kg/m.  General:  Well nourished, well developed, in no acute distress HEENT: normal Lymph: no adenopathy Neck: no JVD Endocrine:  No thryomegaly Cardiac:  Irreg, no m/r/g, no jvd Lungs:  clear to auscultation bilaterally, no wheezing, rhonchi or rales  Abd: soft,  nontender, no hepatomegaly  Ext: no edema Musculoskeletal:  No deformities, BUE and BLE strength normal and equal Skin: warm and dry  Neuro:  CNs 2-12 intact, no focal abnormalities noted Psych:  Normal affect    Laboratory Data:  High Sensitivity Troponin:   Recent Labs  Lab 04/24/19 1024  TROPONINIHS 5.00     Cardiac EnzymesNo results for input(s): TROPONINI in the last 168 hours. No results for input(s): TROPIPOC in the last 168 hours.  Chemistry Recent Labs  Lab 04/24/19 1024  NA 138  K 2.8*  CL 101  CO2 25  GLUCOSE 117*  BUN 7  CREATININE 0.89  CALCIUM 8.9  GFRNONAA >60  GFRAA >60  ANIONGAP 12    Recent Labs  Lab 04/24/19 1024  PROT 6.5  ALBUMIN 3.9  AST 21  ALT 17  ALKPHOS 92  BILITOT 0.6   Hematology Recent Labs  Lab 04/24/19 1024  WBC 5.0  RBC 4.23  HGB 11.1*  HCT 36.0  MCV 85.1  MCH 26.2  MCHC 30.8  RDW 16.1*  PLT 336   BNPNo results for input(s): BNP, PROBNP in the last 168 hours.  DDimer No results for input(s): DDIMER in the last 168 hours.   Radiology/Studies:  Dg Chest Portable 1 View  Result Date: 04/24/2019 CLINICAL DATA:  Shortness of breath and irregular heartbeat. EXAM: PORTABLE CHEST 1 VIEW COMPARISON:  PA and lateral chest 01/30/2017. FINDINGS: Lungs are clear. Heart size is normal. No pneumothorax or pleural effusion. Eventration right hemidiaphragm is unchanged. No acute or focal bony abnormality. IMPRESSION: No acute disease. Electronically Signed   By: Inge Rise M.D.   On: 04/24/2019 11:04    Assessment and Plan:   1. PAF - new diagnosis. She is in and out of afib in the ER - severe hypoklaemia, likely related to some recent diarrhea and also HCTZ. Mg is pending,TSH pending - will give IV lopressor 2.5mg  x 1 now. If rates reasonable would increase her home toprol to 50mg  daily and would be ok for discharge from ER. If ongoing tachycardia could redose IV lopressor pending blood pressures. If rates remain very  volatile may require obs overnight for further management - CHADS2Vasc score is 2, would start eliquis 5mg  bid.    2. Hypokalemia - likely related to HCTZ and recent  diarrhea - give 51mEq oral in ER along with 54mEq IV with NS.  - would stop her HCTZ at home - f/u Mg levels as this may require replacement - if discharged from ER would plan for 109mEq oral of KCl x 3 days and recheck early next week.     Follow rates after IV lopressor. F/u Mg and replace if needed. If rates reasonably controlled would be ok for discharge from ER with increaseing her toprol to 50mg  daily, if still very labile would need obs overnight for continued management.  For questions or updates, please contact [place] Please consult [email or web address] for contact info under     Signed, Carlyle Dolly, MD  04/24/2019 11:24 AM

## 2019-04-24 NOTE — ED Provider Notes (Signed)
[facility] EMERGENCY DEPARTMENT Provider Note   CSN: [id number] Arrival date & time: 04/24/19  0102     History   Chief Complaint Chief Complaint  Patient presents with  . Tachycardia    HPI Holly Krueger is a 53 y.o. female.     Patient complains of weakness and shortness of breath.  Patient also having some palpitations  The history is provided by the patient. No language interpreter was used.  Weakness Severity:  Moderate Onset quality:  Sudden Timing:  Constant Progression:  Worsening Chronicity:  New Context: not alcohol use   Relieved by:  Nothing Worsened by:  Nothing Ineffective treatments:  None tried Associated symptoms: no abdominal pain, no chest pain, no cough, no diarrhea, no frequency, no headaches and no seizures     Past Medical History:  Diagnosis Date  . Endometriosis   . GERD (gastroesophageal reflux disease)   . Headache(784.0)   . Hypertension   . Kidney stone     Patient Active Problem List   Diagnosis Date Noted  . Cystocele 12/23/2013  . Perimenopausal vasomotor symptoms 03/21/2013    Past Surgical History:  Procedure Laterality Date  . ABDOMINAL HYSTERECTOMY    . APPENDECTOMY    . COLONOSCOPY N/A 10/02/2015   Procedure: COLONOSCOPY;  Surgeon: Danie Binder, MD;  Location: [facility];  Service: Endoscopy;  Laterality: N/A;  830   . FOOT SURGERY       OB History    Gravida  1   Para  1   Term      Preterm      AB      Living  1     SAB      TAB      Ectopic      Multiple      Live Births  1            Home Medications    Prior to Admission medications   Medication Sig Start Date End Date Taking? Authorizing Provider  albuterol (PROVENTIL HFA;VENTOLIN HFA) 108 (90 Base) MCG/ACT inhaler INHALE 1 TO 2 PUFFS INTO LUNGS EVERY 4 HOURS AS NEEDED 10/08/18  Yes [provider]  azaTHIOprine (IMURAN) 50 MG tablet Take 1 tablet by mouth daily. 04/16/19  Yes [provider]  gabapentin  (NEURONTIN) 300 MG capsule  12/05/18  Yes [provider]  hydrochlorothiazide (HYDRODIURIL) 25 MG tablet Take 25 mg by mouth daily. 11/01/18  Yes [provider]  montelukast (SINGULAIR) 10 MG tablet Take 1 tablet by mouth daily. 03/29/19  Yes [provider]  omeprazole (PRILOSEC) 20 MG capsule TAKE 1 CAPSULE BY MOUTH ONCE DAILY 30 MINS PRIOR  TO  BREAKFAST  ON  SAT  AND  SUN Patient taking differently: Take 2 mg by mouth. TAKE 1 CAPSULE BY MOUTH ONCE DAILY 30 MINS PRIOR  TO  BREAKFAST  ON  SAT  AND  SUN 05/09/18  Yes Carlis Stable, NP  QUEtiapine (SEROQUEL) 50 MG tablet Take 1 tablet by mouth daily. 04/01/19  Yes [provider]  rOPINIRole (REQUIP) 1 MG tablet Take 1 mg by mouth at bedtime. 11/15/18  Yes [provider]  zolpidem (AMBIEN) 10 MG tablet Take 1 tablet (10 mg total) by mouth at bedtime as needed for sleep. Up 5 times weekly 03/21/13  Yes Jonnie Kind, MD  apixaban (ELIQUIS) 5 MG TABS tablet Take 1 tablet (5 mg total) by mouth 2 (two) times daily. 04/24/19 05/24/19  Milton Ferguson, MD  benzonatate (TESSALON) 100 MG capsule TAKE 1 CAPSULE BY MOUTH THREE TIMES DAILY AS NEEDED 10/13/18   [provider]  diphenhydramine-acetaminophen (TYLENOL PM) 25-500 MG TABS tablet Take 1-2 tablets by mouth at bedtime as needed (sleep).    [provider]  hydrOXYzine (VISTARIL) 25 MG capsule  06/06/16   [provider]  metoprolol succinate (TOPROL-XL) 50 MG 24 hr tablet Take 1 tablet (50 mg total) by mouth daily. 04/24/19   Milton Ferguson, MD  ondansetron (ZOFRAN) 4 MG tablet TAKE 1 TABLET BY MOUTH EVERY 6 HOURS AS NEEDED FOR 3 DAYS 10/19/18   [provider]  oseltamivir (TAMIFLU) 75 MG capsule Take 75 mg by mouth daily. 11/12/18   [provider]  oxyCODONE-acetaminophen (PERCOCET/ROXICET) 5-325 MG tablet Take 1 tablet by mouth every 4 (four) hours as needed. Patient not taking: Reported on 04/24/2019 11/06/15   Evalee Jefferson,  PA-C  potassium chloride SA (K-DUR) 20 MEQ tablet Take 2 tablets (40 mEq total) by mouth daily. 04/24/19   Milton Ferguson, MD  ranitidine (ZANTAC) 150 MG tablet Take 1 tablet (150 mg total) by mouth 2 (two) times daily. Patient not taking: Reported on 04/24/2019 12/26/16   Danie Binder, MD  VIRTUSSIN A/C 100-10 MG/5ML syrup TAKE 10 ML BY MOUTH EVERY 4 HOURS AS NEEDED 10/08/18   [provider]    Family History Family History  Problem Relation Age of Onset  . Hyperlipidemia Mother   . Hypertension Father     Social History Social History   Tobacco Use  . Smoking status: Never Smoker  . Smokeless tobacco: Never Used  Substance Use Topics  . Alcohol use: No  . Drug use: No     Allergies   Codeine and Hydrocodone   Review of Systems Review of Systems  Constitutional: Negative for appetite change and fatigue.  HENT: Negative for congestion, ear discharge and sinus pressure.   Eyes: Negative for discharge.  Respiratory: Negative for cough.   Cardiovascular: Negative for chest pain.  Gastrointestinal: Negative for abdominal pain and diarrhea.  Genitourinary: Negative for frequency and hematuria.  Musculoskeletal: Negative for back pain.  Skin: Negative for rash.  Neurological: Positive for weakness. Negative for seizures and headaches.  Psychiatric/Behavioral: Negative for hallucinations.     Physical Exam Updated Vital Signs BP 105/69   Pulse 65   Temp 98.6 F (37 C) (Oral)   Resp (!) 21   Ht 5\' 4"  (1.626 m)   Wt 102.1 kg   SpO2 100%   BMI 38.62 kg/m   Physical Exam Vitals signs and nursing note reviewed.  Constitutional:      Appearance: She is well-developed.  HENT:     Head: Normocephalic.     Nose: Nose normal.  Eyes:     General: No scleral icterus.    Conjunctiva/sclera: Conjunctivae normal.  Neck:     Musculoskeletal: Neck supple.     Thyroid: No thyromegaly.  Cardiovascular:     Heart sounds: No murmur. No friction rub. No gallop.       Comments: Irregular rapid heartbeat Pulmonary:     Breath sounds: No stridor. No wheezing or rales.  Chest:     Chest wall: No tenderness.  Abdominal:     General: There is no distension.     Tenderness: There is no abdominal tenderness. There is no rebound.  Musculoskeletal: Normal range of motion.  Lymphadenopathy:     Cervical: No cervical adenopathy.  Skin:  Findings: No erythema or rash.  Neurological:     Mental Status: She is oriented to person, place, and time.     Motor: No abnormal muscle tone.     Coordination: Coordination normal.  Psychiatric:        Behavior: Behavior normal.      ED Treatments / Results  Labs (all labs ordered are listed, but only abnormal results are displayed) Labs Reviewed  CBC WITH DIFFERENTIAL/PLATELET - Abnormal; Notable for the following components:      Result Value   Hemoglobin 11.1 (*)    RDW 16.1 (*)    All other components within normal limits  COMPREHENSIVE METABOLIC PANEL - Abnormal; Notable for the following components:   Potassium 2.8 (*)    Glucose, Bld 117 (*)    All other components within normal limits  TROPONIN I (HIGH SENSITIVITY)  TROPONIN I (HIGH SENSITIVITY)  TSH  MAGNESIUM    EKG None  Radiology Dg Chest Portable 1 View  Result Date: 04/24/2019 CLINICAL DATA:  Shortness of breath and irregular heartbeat. EXAM: PORTABLE CHEST 1 VIEW COMPARISON:  PA and lateral chest 01/30/2017. FINDINGS: Lungs are clear. Heart size is normal. No pneumothorax or pleural effusion. Eventration right hemidiaphragm is unchanged. No acute or focal bony abnormality. IMPRESSION: No acute disease. Electronically Signed   By: Inge Rise M.D.   On: 04/24/2019 11:04    Procedures Procedures (including critical care time)  Medications Ordered in ED Medications  apixaban (ELIQUIS) tablet 5 mg (5 mg Oral Given 04/24/19 1334)  potassium chloride SA (K-DUR) CR tablet 40 mEq (40 mEq Oral Given 04/24/19 1119)  potassium chloride 10  mEq in 100 mL IVPB ( Intravenous Stopped 04/24/19 1216)  metoprolol tartrate (LOPRESSOR) injection 2.5 mg (2.5 mg Intravenous Given 04/24/19 1211)     Initial Impression / Assessment and Plan / ED Course  I have reviewed the triage vital signs and the nursing notes.  Pertinent labs & imaging results that were available during my care of the patient were reviewed by me and considered in my medical decision making (see chart for details).    CRITICAL CARE Performed by: Milton Ferguson Total critical care time42minutes Critical care time was exclusive of separately billable procedures and treating other patients. Critical care was necessary to treat or prevent imminent or life-threatening deterioration. Critical care was time spent personally by me on the following activities: development of treatment plan with patient and/or surrogate as well as nursing, discussions with consultants, evaluation of patient's response to treatment, examination of patient, obtaining history from patient or surrogate, ordering and performing treatments and interventions, ordering and review of laboratory studies, ordering and review of radiographic studies, pulse oximetry and re-evaluation of patient's condition.      Patient was in atrial fib.  She was seen by cardiology and given some Lopressor.  She converted to normal sinus.  She will increase her Toprol to 50 mg a day start Eliquis and is also given some potassium because she is hypokalemic.  Patient is to follow-up with cardiology and stop her HCTZ Final Clinical Impressions(s) / ED Diagnoses   Final diagnoses:  Atrial fibrillation with rapid ventricular response [facility])    ED Discharge Orders         Ordered    metoprolol succinate (TOPROL-XL) 50 MG 24 hr tablet  Daily     04/24/19 1415    apixaban (ELIQUIS) 5 MG TABS tablet  2 times daily     04/24/19 1415  potassium chloride SA (K-DUR) 20 MEQ tablet  Daily     04/24/19 1415           Milton Ferguson, MD 04/24/19 1424

## 2019-04-24 NOTE — ED Triage Notes (Addendum)
Pt works in short stay  Reports walking back out to car to get donut and became SOB. After eating, still SOB. Checked pulse [address] then decreased in 40's. EKG done in short stay with rate up to 155. Pt took 4 baby asa

## 2019-04-24 NOTE — ED Notes (Signed)
ED Provider at bedside. 

## 2019-04-25 ENCOUNTER — Telehealth: Payer: Self-pay

## 2019-04-25 DIAGNOSIS — I4891 Unspecified atrial fibrillation: Secondary | ICD-10-CM

## 2019-04-25 DIAGNOSIS — E876 Hypokalemia: Secondary | ICD-10-CM

## 2019-04-25 MED FILL — POTASSIUM CHLORIDE CRYS ER: 20 | 3 days supply | Qty: 6 | Fill #0

## 2019-04-25 MED FILL — METOPROLOL SUCCINATE ER 50: 50 | 30 days supply | Qty: 30 | Fill #0

## 2019-04-25 MED FILL — ELIQUIS 5 MG TABLET: 5 | 30 days supply | Qty: 60 | Fill #0

## 2019-04-25 NOTE — Telephone Encounter (Signed)
Done

## 2019-04-25 NOTE — Telephone Encounter (Signed)
-----   Message from Erma Heritage, [place] sent at 04/24/2019  4:15 PM EDT ----- Regarding: Outpatient Echo and BMET Lattie Haw,   Can you help with orders for an echocardiogram and BMET for this patient for next week per Dr. Harl Bowie? Was discharged from the ED this afternoon. Echo is for atrial fibrillation and BMET is for hypokalemia. I had arranged her follow-up visit before she was discharged but just received his message about the labs and echo.   Thanks,  [place]

## 2019-05-03 ENCOUNTER — Encounter (INDEPENDENT_AMBULATORY_CARE_PROVIDER_SITE_OTHER): Payer: 59 | Admitting: Ophthalmology

## 2019-05-10 ENCOUNTER — Telehealth: Payer: Self-pay | Admitting: *Deleted

## 2019-05-10 ENCOUNTER — Ambulatory Visit (HOSPITAL_COMMUNITY)
Admission: RE | Admit: 2019-05-10 | Discharge: 2019-05-10 | Disposition: A | Discharge status: Home / Self Care | Payer: 59 | Source: Ambulatory Visit | Attending: Student | Admitting: Student

## 2019-05-10 ENCOUNTER — Other Ambulatory Visit: Payer: Self-pay

## 2019-05-10 DIAGNOSIS — I4891 Unspecified atrial fibrillation: Secondary | ICD-10-CM | POA: Insufficient documentation

## 2019-05-10 NOTE — Progress Notes (Signed)
*  PRELIMINARY RESULTS* Echocardiogram 2D Echocardiogram has been performed.  Holly Krueger 05/10/2019, 9:15 AM

## 2019-05-10 NOTE — Telephone Encounter (Signed)
-----   Message from Erma Heritage, [place] sent at 05/10/2019 11:20 AM EDT ----- Please let the patient know her echocardiogram showed normal pumping function of the heart with a preserved EF of 60 to 65%.  No regional wall motion abnormalities. No significant valve abnormalities.  Please forward a copy of results to Sharilyn Sites, MD.

## 2019-05-10 NOTE — Telephone Encounter (Signed)
Called patient with test results. No answer. Left message to call back.  

## 2019-05-13 MED FILL — QUETIAPINE FUMARATE 50 MG T: 50 | 30 days supply | Qty: 30 | Fill #1

## 2019-05-15 ENCOUNTER — Encounter: Payer: Self-pay | Admitting: Student

## 2019-05-15 ENCOUNTER — Ambulatory Visit (INDEPENDENT_AMBULATORY_CARE_PROVIDER_SITE_OTHER): Payer: 59 | Admitting: Student

## 2019-05-15 ENCOUNTER — Other Ambulatory Visit: Payer: Self-pay

## 2019-05-15 VITALS — BP 136/82 | HR 76 | Temp 97.7°F | Ht 64.0 in | Wt 233.0 lb

## 2019-05-15 DIAGNOSIS — I480 Paroxysmal atrial fibrillation: Secondary | ICD-10-CM

## 2019-05-15 DIAGNOSIS — Z79899 Other long term (current) drug therapy: Secondary | ICD-10-CM

## 2019-05-15 DIAGNOSIS — I10 Essential (primary) hypertension: Secondary | ICD-10-CM | POA: Diagnosis not present

## 2019-05-15 DIAGNOSIS — E876 Hypokalemia: Secondary | ICD-10-CM | POA: Diagnosis not present

## 2019-05-15 MED ORDER — APIXABAN 5 MG PO TABS: 5.0000 mg | ORAL_TABLET | Freq: Two times a day (BID) | ORAL | 3 refills | Status: DC

## 2019-05-15 MED ORDER — METOPROLOL SUCCINATE ER 50 MG PO TB24: 50.0000 mg | ORAL_TABLET | Freq: Every day | ORAL | 3 refills | Status: DC

## 2019-05-15 NOTE — Progress Notes (Signed)
Cardiology Office Note    Date:  05/15/2019   ID:  MAURY BAMBA, DOB 1966/01/16, MRN [id number]  PCP:  Sharilyn Sites, MD  Cardiologist: Carlyle Dolly, MD    Chief Complaint  Patient presents with  . Follow-up    Recent Emergency Dept visit    History of Present Illness:    Holly Krueger is a 53 y.o. female with past medical history of HTN and GERD who presents to the office today for follow-up from her recent Emergency Department visit.   She presented to [facility] ED on 04/24/2019 after having dizziness and dyspnea while at work (works as a Marine scientist in the [employer] Checked her HR on a pulse ox and noted her pulse was irregular which prompted her to come to the ED. Labs showed WBC 5.0, Hgb 11.1, platelets 336, Na+ 138, K+ 2.8, and creatinine 0.89. Initial and delta HS Troponin values were negative. TSH 1.265. Mg 1.9. EKG showed atrial fibrillation with RVR, HR 124. Was having paroxysmal episodes while in the ED and was given IV Lopressor x1 dose along with K+ supplementation. Was evaluated by Dr. Harl Bowie and started on Toprol-XL 50mg  daily (previously on 25mg  daily) along with Eliquis 5mg  BID. She had an outpatient echocardiogram on 05/10/2019 and showed a preserved EF of 60-65% with no regional WMA. LA was mildly dilated. No significant valve abnormalities were identified.  In talking with the patient today, she reports overall doing well and denies any recurrent episodes of dizziness or dyspnea resembling what she felt when she was in atrial fibrillation. She denies any specific palpitations at that time. No recent chest pain, orthopnea, or PND. She does experience intermittent lower extremity edema but works as a Marine scientist and reports being on her feet for 8 to 10 hours a day. Has utilized compression stockings in the past with some improvement. Weight overall stable when checked at home.   She initially had fatigue with titration of Toprol-XL but reports now taking the  medication in the evening and is overall tolerating this well.  She does miss occasional doses of Eliquis in the evening. Denies any evidence of active bleeding.   Past Medical History:  Diagnosis Date  . Endometriosis   . GERD (gastroesophageal reflux disease)   . Headache(784.0)   . Hypertension   . Kidney stone   . PAF (paroxysmal atrial fibrillation) ([place])     Past Surgical History:  Procedure Laterality Date  . ABDOMINAL HYSTERECTOMY    . APPENDECTOMY    . COLONOSCOPY N/A 10/02/2015   Procedure: COLONOSCOPY;  Surgeon: Danie Binder, MD;  Location: [facility];  Service: Endoscopy;  Laterality: N/A;  830   . FOOT SURGERY      Current Medications: Outpatient Medications Prior to Visit  Medication Sig Dispense Refill  . gabapentin (NEURONTIN) 300 MG capsule     . hydrOXYzine (VISTARIL) 25 MG capsule     . montelukast (SINGULAIR) 10 MG tablet Take 1 tablet by mouth daily.    Marland Kitchen omeprazole (PRILOSEC) 20 MG capsule TAKE 1 CAPSULE BY MOUTH ONCE DAILY 30 MINS PRIOR  TO  BREAKFAST  ON  SAT  AND  SUN (Patient taking differently: Take 2 mg by mouth. TAKE 1 CAPSULE BY MOUTH ONCE DAILY 30 MINS PRIOR  TO  BREAKFAST  ON  SAT  AND  SUN) 30 capsule 11  . ondansetron (ZOFRAN) 4 MG tablet TAKE 1 TABLET BY MOUTH EVERY 6 HOURS AS NEEDED FOR 3 DAYS    .  QUEtiapine (SEROQUEL) 50 MG tablet Take 1 tablet by mouth daily.    Marland Kitchen rOPINIRole (REQUIP) 1 MG tablet Take 1 mg by mouth at bedtime.    Marland Kitchen zolpidem (AMBIEN) 10 MG tablet Take 1 tablet (10 mg total) by mouth at bedtime as needed for sleep. Up 5 times weekly 30 tablet 5  . apixaban (ELIQUIS) 5 MG TABS tablet Take 1 tablet (5 mg total) by mouth 2 (two) times daily. 60 tablet 0  . metoprolol succinate (TOPROL-XL) 50 MG 24 hr tablet Take 1 tablet (50 mg total) by mouth daily. 30 tablet 0  . albuterol (PROVENTIL HFA;VENTOLIN HFA) 108 (90 Base) MCG/ACT inhaler INHALE 1 TO 2 PUFFS INTO LUNGS EVERY 4 HOURS AS NEEDED    . azaTHIOprine (IMURAN) 50 MG tablet  Take 1 tablet by mouth daily.    . benzonatate (TESSALON) 100 MG capsule TAKE 1 CAPSULE BY MOUTH THREE TIMES DAILY AS NEEDED    . diphenhydramine-acetaminophen (TYLENOL PM) 25-500 MG TABS tablet Take 1-2 tablets by mouth at bedtime as needed (sleep).    . hydrochlorothiazide (HYDRODIURIL) 25 MG tablet Take 25 mg by mouth daily.    Marland Kitchen oseltamivir (TAMIFLU) 75 MG capsule Take 75 mg by mouth daily.    Marland Kitchen oxyCODONE-acetaminophen (PERCOCET/ROXICET) 5-325 MG tablet Take 1 tablet by mouth every 4 (four) hours as needed. (Patient not taking: Reported on 04/24/2019) 20 tablet 0  . potassium chloride SA (K-DUR) 20 MEQ tablet Take 2 tablets (40 mEq total) by mouth daily. 6 tablet 0  . ranitidine (ZANTAC) 150 MG tablet Take 1 tablet (150 mg total) by mouth 2 (two) times daily. (Patient not taking: Reported on 04/24/2019) 60 tablet 11  . VIRTUSSIN A/C 100-10 MG/5ML syrup TAKE 10 ML BY MOUTH EVERY 4 HOURS AS NEEDED     No facility-administered medications prior to visit.      Allergies:   Codeine and Hydrocodone   Social History   Socioeconomic History  . Marital status: Married    Spouse name: Not on file  . Number of children: Not on file  . Years of education: Not on file  . Highest education level: Not on file  Occupational History  . Not on file  Social Needs  . Financial resource strain: Not on file  . Food insecurity    Worry: Not on file    Inability: Not on file  . Transportation needs    Medical: Not on file    Non-medical: Not on file  Tobacco Use  . Smoking status: Never Smoker  . Smokeless tobacco: Never Used  Substance and Sexual Activity  . Alcohol use: No  . Drug use: No  . Sexual activity: Yes  Lifestyle  . Physical activity    Days per week: Not on file    Minutes per session: Not on file  . Stress: Not on file  Relationships  . Social Herbalist on phone: Not on file    Gets together: Not on file    Attends religious service: Not on file    Active member of  club or organization: Not on file    Attends meetings of clubs or organizations: Not on file    Relationship status: Not on file  Other Topics Concern  . Not on file  Social History Narrative  . Not on file     Family History:  The patient's family history includes Hyperlipidemia in her mother; Hypertension in her father.   Review of Systems:  Please see the history of present illness.     General:  No chills, fever, night sweats or weight changes. Positive for fatigue (now improved).  Cardiovascular:  No chest pain, dyspnea on exertion, edema, orthopnea, palpitations, paroxysmal nocturnal dyspnea. Dermatological: No rash, lesions/masses Respiratory: No cough, dyspnea Urologic: No hematuria, dysuria Abdominal:   No nausea, vomiting, diarrhea, bright red blood per rectum, melena, or hematemesis Neurologic:  No visual changes, wkns, changes in mental status. All other systems reviewed and are otherwise negative except as noted above.   Physical Exam:    VS:  BP 136/82   Pulse 76   Temp 97.7 F (36.5 C)   Ht 5\' 4"  (1.626 m)   Wt 233 lb (105.7 kg)   BMI 39.99 kg/m    General: Well developed, well nourished,female appearing in no acute distress. Head: Normocephalic, atraumatic, sclera non-icteric, no xanthomas, nares are without discharge.  Neck: No carotid bruits. JVD not elevated.  Lungs: Respirations regular and unlabored, without wheezes or rales.  Heart: Regular rate and rhythm. No S3 or S4.  No murmur, no rubs, or gallops appreciated. Abdomen: Soft, non-tender, non-distended with normoactive bowel sounds. No hepatomegaly. No rebound/guarding. No obvious abdominal masses. Msk:  Strength and tone appear normal for age. No joint deformities or effusions. Extremities: No clubbing or cyanosis. No pitting edema.  Distal pedal pulses are 2+ bilaterally. Neuro: Alert and oriented X 3. Moves all extremities spontaneously. No focal deficits noted. Psych:  Responds to questions  appropriately with a normal affect. Skin: No rashes or lesions noted  Wt Readings from Last 3 Encounters:  05/15/19 233 lb (105.7 kg)  04/24/19 225 lb (102.1 kg)  11/06/15 186 lb (84.4 kg)     Studies/Labs Reviewed:   EKG:  EKG is not ordered today.   Recent Labs: 04/24/2019: ALT 17; BUN 7; Creatinine, Ser 0.89; Hemoglobin 11.1; Magnesium 1.9; Platelets 336; Potassium 2.8; Sodium 138; TSH 1.265   Lipid Panel No results found for: CHOL, TRIG, HDL, CHOLHDL, VLDL, LDLCALC, LDLDIRECT  Additional studies/ records that were reviewed today include:   Echocardiogram: 05/10/2019 IMPRESSIONS   1. The left ventricle has normal systolic function with an ejection fraction of 60-65%. The cavity size was normal. There is focal basal septal hypertrophy. Left ventricular diastolic parameters were normal.  2. The right ventricle has normal systolic function. The cavity was normal. There is no increase in right ventricular wall thickness.  3. Left atrial size was mildly dilated.  4. No evidence of mitral valve stenosis.  5. The aortic valve is tricuspid. No stenosis of the aortic valve.  6. The aorta is normal in size and structure.  7. The aortic root is normal in size and structure.  8. Pulmonary hypertension is indeterminate, inadequate TR jet.  Assessment:    1. PAF (paroxysmal atrial fibrillation) ([place])   2. Essential hypertension   3. Hypokalemia   4. Medication management      Plan:   In order of problems listed above:  1. Paroxysmal Atrial Fibrillation - Diagnosed during recent emergency department visit earlier this month. Was found to be hypokalemic at that time in the setting of being on HCTZ and experiencing diarrhea. HCTZ has since been discontinued. Will recheck BMET. Echo showed no significant findings as outlined above.  -  She denies any recurrent episodes of dizziness or dyspnea resembling when in atrial fibrillation. Continue Toprol-XL  - This patients CHA2DS2-VASc  Score and unadjusted Ischemic Stroke Rate (% per year) is equal to 2.2 %  stroke rate/year from a score of 2 (HTN, Female). Continue Eliquis 5mg  BID for anticoagulation. Compliance with BID dosing strongly encouraged. If this continues to be an issue would switch to Xarelto given once daily dosing.   2. HTN - BP well controlled at 136/82 during today's visit. Continue Toprol-XL 50 mg daily. We reviewed that if she had issues with lower extremity edema going forward that HCTZ could perhaps be restarted at a lower dose but we would want to check her potassium levels first and follow closely. She reports previous intolerance to ACE-I and ARB.   3. Hypokalemia - K+ 2.8 during recent Emergency Dept visit. She has not yet had repeat labs so will obtain a BMET tomorrow morning. No longer on K+ supplementation at this time.   Medication Adjustments/Labs and Tests Ordered: Current medicines are reviewed at length with the patient today.  Concerns regarding medicines are outlined above.  Medication changes, Labs and Tests ordered today are listed in the Patient Instructions below. Patient Instructions  Medication Instructions:  Your physician recommends that you continue on your current medications as directed. Please refer to the Current Medication list given to you today.  If you need a refill on your cardiac medications before your next appointment, please call your pharmacy.   Lab work: Your physician recommends that you return for lab work in: Tomorrow   If you have labs (blood work) drawn today and your tests are completely normal, you will receive your results only by: Marland Kitchen MyChart Message (if you have MyChart) OR . A paper copy in the mail If you have any lab test that is abnormal or we need to change your treatment, we will call you to review the results.  Testing/Procedures: NONE   Follow-Up: At [facility], you and your health needs are our priority.  As part of our continuing mission to  provide you with exceptional heart care, we have created designated Provider Care Teams.  These Care Teams include your primary Cardiologist (physician) and Advanced Practice Providers (APPs -  Physician Assistants and Nurse Practitioners) who all work together to provide you with the care you need, when you need it. You will need a follow up appointment in 5-6 months.  Please call our office 2 months in advance to schedule this appointment.  You may see Carlyle Dolly, MD or one of the following Advanced Practice Providers on your designated Care Team:   Bernerd Pho, PA-C [facility]) . Ermalinda Barrios, PA-C ([place])  Any Other Special Instructions Will Be Listed Below (If Applicable). Thank you for choosing [facility]!     Signed, Erma Heritage, PA-C  05/15/2019 5:17 PM    [place] [address] [place], [place] [address] Phone: [phone] Fax: [phone]

## 2019-05-15 NOTE — Patient Instructions (Signed)
Medication Instructions:  Your physician recommends that you continue on your current medications as directed. Please refer to the Current Medication list given to you today.  If you need a refill on your cardiac medications before your next appointment, please call your pharmacy.   Lab work: Your physician recommends that you return for lab work in: Tomorrow   If you have labs (blood work) drawn today and your tests are completely normal, you will receive your results only by: Marland Kitchen MyChart Message (if you have MyChart) OR . A paper copy in the mail If you have any lab test that is abnormal or we need to change your treatment, we will call you to review the results.  Testing/Procedures: NONE   Follow-Up: At [facility], you and your health needs are our priority.  As part of our continuing mission to provide you with exceptional heart care, we have created designated Provider Care Teams.  These Care Teams include your primary Cardiologist (physician) and Advanced Practice Providers (APPs -  Physician Assistants and Nurse Practitioners) who all work together to provide you with the care you need, when you need it. You will need a follow up appointment in 5-6 months.  Please call our office 2 months in advance to schedule this appointment.  You may see Carlyle Dolly, MD or one of the following Advanced Practice Providers on your designated Care Team:   Bernerd Pho, PA-C [facility]) . Ermalinda Barrios, PA-C ([place])  Any Other Special Instructions Will Be Listed Below (If Applicable). Thank you for choosing [facility]!

## 2019-05-16 ENCOUNTER — Other Ambulatory Visit: Payer: Self-pay

## 2019-05-16 ENCOUNTER — Other Ambulatory Visit (HOSPITAL_COMMUNITY)
Admission: RE | Admit: 2019-05-16 | Discharge: 2019-05-16 | Disposition: A | Discharge status: Home / Self Care | Payer: 59 | Source: Ambulatory Visit | Attending: Student | Admitting: Student

## 2019-05-16 DIAGNOSIS — Z79899 Other long term (current) drug therapy: Secondary | ICD-10-CM | POA: Insufficient documentation

## 2019-05-16 LAB — BASIC METABOLIC PANEL
Anion gap: 8 (ref 5–15)
BUN: 9 mg/dL (ref 6–20)
CO2: 25 mmol/L (ref 22–32)
Calcium: 8.8 mg/dL — ABNORMAL LOW (ref 8.9–10.3)
Chloride: 108 mmol/L (ref 98–111)
Creatinine, Ser: 0.9 mg/dL (ref 0.44–1.00)
GFR calc Af Amer: >60 mL/min (ref >60)
GFR calc non Af Amer: >60 mL/min (ref >60)
Glucose, Bld: 100 mg/dL — ABNORMAL HIGH (ref 70–99)
Potassium: 3.7 mmol/L (ref 3.5–5.1)
Sodium: 141 mmol/L (ref 135–145)

## 2019-05-16 MED FILL — OMEPRAZOLE 40 MG CPDR: 40 | 30 days supply | Qty: 60 | Fill #1

## 2019-05-16 MED FILL — ELIQUIS 5 MG TABLET: 5 | 90 days supply | Qty: 180 | Fill #0

## 2019-05-16 MED FILL — GABAPENTIN 300 MG CAPSULE: 300 | 30 days supply | Qty: 30 | Fill #3

## 2019-05-19 MED FILL — METOPROLOL SUCCINATE ER 50: 50 | 90 days supply | Qty: 90 | Fill #0

## 2019-06-06 ENCOUNTER — Other Ambulatory Visit: Payer: Self-pay

## 2019-06-06 DIAGNOSIS — Z20822 Contact with and (suspected) exposure to covid-19: Secondary | ICD-10-CM

## 2019-06-07 MED FILL — azaTHIOprine 50 MG TABS: 50 | 90 days supply | Qty: 180 | Fill #0

## 2019-06-08 LAB — NOVEL CORONAVIRUS, NAA: SARS-CoV-2, NAA: NOT DETECTED

## 2019-06-11 DIAGNOSIS — H35063 Retinal vasculitis, bilateral: Secondary | ICD-10-CM | POA: Diagnosis not present

## 2019-06-11 DIAGNOSIS — H3581 Retinal edema: Secondary | ICD-10-CM | POA: Diagnosis not present

## 2019-06-11 DIAGNOSIS — H30033 Focal chorioretinal inflammation, peripheral, bilateral: Secondary | ICD-10-CM | POA: Diagnosis not present

## 2019-06-11 DIAGNOSIS — H2513 Age-related nuclear cataract, bilateral: Secondary | ICD-10-CM | POA: Diagnosis not present

## 2019-06-11 DIAGNOSIS — H35033 Hypertensive retinopathy, bilateral: Secondary | ICD-10-CM | POA: Diagnosis not present

## 2019-06-18 MED FILL — GABAPENTIN 300 MG CAPSULE: 300 | 90 days supply | Qty: 90 | Fill #0

## 2019-06-18 MED FILL — OMEPRAZOLE DR 40 MG CAPSULE: 40 | 90 days supply | Qty: 180 | Fill #0

## 2019-06-18 MED FILL — QUETIAPINE FUMARATE 50 MG T: 50 | 30 days supply | Qty: 30 | Fill #2

## 2019-06-19 DIAGNOSIS — F5101 Primary insomnia: Secondary | ICD-10-CM | POA: Diagnosis not present

## 2019-06-19 DIAGNOSIS — G2581 Restless legs syndrome: Secondary | ICD-10-CM | POA: Diagnosis not present

## 2019-07-01 DIAGNOSIS — J22 Unspecified acute lower respiratory infection: Secondary | ICD-10-CM | POA: Diagnosis not present

## 2019-07-01 DIAGNOSIS — Z6841 Body Mass Index (BMI) 40.0 and over, adult: Secondary | ICD-10-CM | POA: Diagnosis not present

## 2019-07-05 ENCOUNTER — Encounter (HOSPITAL_COMMUNITY): Payer: Self-pay | Admitting: Emergency Medicine

## 2019-07-05 ENCOUNTER — Emergency Department (HOSPITAL_COMMUNITY): Payer: 59

## 2019-07-05 ENCOUNTER — Encounter: Payer: Self-pay | Admitting: Cardiology

## 2019-07-05 ENCOUNTER — Ambulatory Visit (INDEPENDENT_AMBULATORY_CARE_PROVIDER_SITE_OTHER): Payer: 59 | Admitting: Cardiology

## 2019-07-05 ENCOUNTER — Other Ambulatory Visit (INDEPENDENT_AMBULATORY_CARE_PROVIDER_SITE_OTHER): Payer: 59

## 2019-07-05 ENCOUNTER — Emergency Department (HOSPITAL_COMMUNITY)
Admission: EM | Admit: 2019-07-05 | Discharge: 2019-07-05 | Disposition: A | Discharge status: Home / Self Care | Payer: 59 | Attending: Emergency Medicine | Admitting: Emergency Medicine

## 2019-07-05 ENCOUNTER — Other Ambulatory Visit: Payer: Self-pay

## 2019-07-05 VITALS — BP 116/84 | HR 155 | Ht 64.0 in | Wt 225.0 lb

## 2019-07-05 DIAGNOSIS — I4891 Unspecified atrial fibrillation: Secondary | ICD-10-CM | POA: Insufficient documentation

## 2019-07-05 DIAGNOSIS — I10 Essential (primary) hypertension: Secondary | ICD-10-CM | POA: Insufficient documentation

## 2019-07-05 DIAGNOSIS — Z7901 Long term (current) use of anticoagulants: Secondary | ICD-10-CM | POA: Diagnosis not present

## 2019-07-05 DIAGNOSIS — Z79899 Other long term (current) drug therapy: Secondary | ICD-10-CM | POA: Diagnosis not present

## 2019-07-05 DIAGNOSIS — I480 Paroxysmal atrial fibrillation: Secondary | ICD-10-CM

## 2019-07-05 DIAGNOSIS — R000 Tachycardia, unspecified: Secondary | ICD-10-CM

## 2019-07-05 DIAGNOSIS — R002 Palpitations: Secondary | ICD-10-CM

## 2019-07-05 DIAGNOSIS — R0602 Shortness of breath: Secondary | ICD-10-CM | POA: Diagnosis not present

## 2019-07-05 LAB — BASIC METABOLIC PANEL
Anion gap: 15 (ref 5–15)
BUN: 13 mg/dL (ref 6–20)
CO2: 14 mmol/L — ABNORMAL LOW (ref 22–32)
Calcium: 8.2 mg/dL — ABNORMAL LOW (ref 8.9–10.3)
Chloride: 108 mmol/L (ref 98–111)
Creatinine, Ser: 0.93 mg/dL (ref 0.44–1.00)
GFR calc Af Amer: >60 mL/min (ref >60)
GFR calc non Af Amer: >60 mL/min (ref >60)
Glucose, Bld: 163 mg/dL — ABNORMAL HIGH (ref 70–99)
Potassium: 3.1 mmol/L — ABNORMAL LOW (ref 3.5–5.1)
Sodium: 137 mmol/L (ref 135–145)

## 2019-07-05 LAB — URINALYSIS, ROUTINE W REFLEX MICROSCOPIC
Bilirubin Urine: NEGATIVE
Glucose, UA: NEGATIVE mg/dL
Hgb urine dipstick: NEGATIVE
Ketones, ur: NEGATIVE mg/dL
Leukocytes,Ua: NEGATIVE
Nitrite: NEGATIVE
Protein, ur: NEGATIVE mg/dL
Specific Gravity, Urine: 1.003 — ABNORMAL LOW (ref 1.005–1.030)
pH: 7 (ref 5.0–8.0)

## 2019-07-05 LAB — CBC WITH DIFFERENTIAL/PLATELET
Abs Immature Granulocytes: 0.06 10*3/uL (ref 0.00–0.07)
Basophils Absolute: 0.1 10*3/uL (ref 0.0–0.1)
Basophils Relative: 1 %
Eosinophils Absolute: 0 10*3/uL (ref 0.0–0.5)
Eosinophils Relative: 0 %
HCT: 35.4 % — ABNORMAL LOW (ref 36.0–46.0)
Hemoglobin: 10.8 g/dL — ABNORMAL LOW (ref 12.0–15.0)
Immature Granulocytes: 1 %
Lymphocytes Relative: 9 %
Lymphs Abs: 0.9 10*3/uL (ref 0.7–4.0)
MCH: 27 pg (ref 26.0–34.0)
MCHC: 30.5 g/dL (ref 30.0–36.0)
MCV: 88.5 fL (ref 80.0–100.0)
Monocytes Absolute: 0.4 10*3/uL (ref 0.1–1.0)
Monocytes Relative: 3 %
Neutro Abs: 9 10*3/uL — ABNORMAL HIGH (ref 1.7–7.7)
Neutrophils Relative %: 86 %
Platelets: 344 10*3/uL (ref 150–400)
RBC: 4 MIL/uL (ref 3.87–5.11)
RDW: 17.6 % — ABNORMAL HIGH (ref 11.5–15.5)
WBC: 10.4 10*3/uL (ref 4.0–10.5)
nRBC: 0 % (ref 0.0–0.2)

## 2019-07-05 MED ORDER — METOPROLOL TARTRATE 5 MG/5ML IV SOLN
10.0000 mg | Freq: Once | INTRAVENOUS | Status: AC
  Administered 2019-07-05: 10 mg via INTRAVENOUS
  Filled 2019-07-05: qty 10

## 2019-07-05 MED ORDER — METOPROLOL TARTRATE 5 MG/5ML IV SOLN
5.0000 mg | Freq: Once | INTRAVENOUS | Status: AC
  Administered 2019-07-05: 5 mg via INTRAVENOUS
  Filled 2019-07-05: qty 5

## 2019-07-05 MED ORDER — SODIUM CHLORIDE 0.9 % IV BOLUS
1000.0000 mL | Freq: Once | INTRAVENOUS | Status: AC
  Administered 2019-07-05: 1000 mL via INTRAVENOUS

## 2019-07-05 MED ORDER — POTASSIUM CHLORIDE CRYS ER 20 MEQ PO TBCR
40.0000 meq | EXTENDED_RELEASE_TABLET | Freq: Once | ORAL | Status: AC
  Administered 2019-07-05: 40 meq via ORAL
  Filled 2019-07-05: qty 2

## 2019-07-05 MED ORDER — METOPROLOL SUCCINATE ER 25 MG PO TB24
50.0000 mg | ORAL_TABLET | Freq: Every day | ORAL | Status: DC
  Administered 2019-07-05: 50 mg via ORAL
  Filled 2019-07-05: qty 2

## 2019-07-05 NOTE — Progress Notes (Signed)
Clinical Summary Holly Krueger is a 53 y.o.female seen today as a clinic add on for sudden onsent palpitations.   1. PAF - relatively new diagnosis during recent ER presentation in setting of significant hypokalemia - had done well last few months. However missed her metoprolol and eliquis yesterday, recent bronchitis with steroid taper. Today sudden onset of severe palpitations, SOB.   Past Medical History:  Diagnosis Date  . Endometriosis   . GERD (gastroesophageal reflux disease)   . Headache(784.0)   . Hypertension   . Kidney stone   . PAF (paroxysmal atrial fibrillation) ([facility])      Allergies  Allergen Reactions  . Codeine Hives  . Hydrocodone Hives     Current Outpatient Medications  Medication Sig Dispense Refill  . albuterol (PROVENTIL HFA;VENTOLIN HFA) 108 (90 Base) MCG/ACT inhaler INHALE 1 TO 2 PUFFS INTO LUNGS EVERY 4 HOURS AS NEEDED    . apixaban (ELIQUIS) 5 MG TABS tablet Take 1 tablet (5 mg total) by mouth 2 (two) times daily. 180 tablet 3  . azaTHIOprine (IMURAN) 50 MG tablet Take 1 tablet by mouth daily.    Marland Kitchen gabapentin (NEURONTIN) 300 MG capsule     . hydrOXYzine (VISTARIL) 25 MG capsule     . metoprolol succinate (TOPROL-XL) 50 MG 24 hr tablet Take 1 tablet (50 mg total) by mouth daily. 90 tablet 3  . montelukast (SINGULAIR) 10 MG tablet Take 1 tablet by mouth daily.    Marland Kitchen omeprazole (PRILOSEC) 20 MG capsule TAKE 1 CAPSULE BY MOUTH ONCE DAILY 30 MINS PRIOR  TO  BREAKFAST  ON  SAT  AND  SUN (Patient taking differently: Take 2 mg by mouth. TAKE 1 CAPSULE BY MOUTH ONCE DAILY 30 MINS PRIOR  TO  BREAKFAST  ON  SAT  AND  SUN) 30 capsule 11  . ondansetron (ZOFRAN) 4 MG tablet TAKE 1 TABLET BY MOUTH EVERY 6 HOURS AS NEEDED FOR 3 DAYS    . QUEtiapine (SEROQUEL) 50 MG tablet Take 1 tablet by mouth daily.    Marland Kitchen rOPINIRole (REQUIP) 1 MG tablet Take 1 mg by mouth at bedtime.    Marland Kitchen zolpidem (AMBIEN) 10 MG tablet Take 1 tablet (10 mg total) by mouth at bedtime as needed for  sleep. Up 5 times weekly 30 tablet 5   No current facility-administered medications for this visit.      Past Surgical History:  Procedure Laterality Date  . ABDOMINAL HYSTERECTOMY    . APPENDECTOMY    . COLONOSCOPY N/A 10/02/2015   Procedure: COLONOSCOPY;  Surgeon: Danie Binder, MD;  Location[facility];  Service: Endoscopy;  Laterality: N/A;  830   . FOOT SURGERY       Allergies  Allergen Reactions  . Codeine Hives  . Hydrocodone Hives      Family History  Problem Relation Age of Onset  . Hyperlipidemia Mother   . Hypertension Father      Social History Holly Krueger reports that she has never smoked. She has never used smokeless tobacco. Holly Krueger reports no history of alcohol use.   Review of Systems CONSTITUTIONAL: No weight loss, fever, chills, weakness or fatigue.  HEENT: Eyes: No visual loss, blurred vision, double vision or yellow sclerae.No hearing loss, sneezing, congestion, runny nose or sore throat.  SKIN: No rash or itching.  CARDIOVASCULAR: per hpi RESPIRATORY: per hpi GASTROINTESTINAL: No anorexia, nausea, vomiting or diarrhea. No abdominal pain or blood.  GENITOURINARY: No burning on urination, no polyuria  NEUROLOGICAL: No headache, dizziness, syncope, paralysis, ataxia, numbness or tingling in the extremities. No change in bowel or bladder control.  MUSCULOSKELETAL: No muscle, back pain, joint pain or stiffness.  LYMPHATICS: No enlarged nodes. No history of splenectomy.  PSYCHIATRIC: No history of depression or anxiety.  ENDOCRINOLOGIC: No reports of sweating, cold or heat intolerance. No polyuria or polydipsia.  Marland Kitchen   Physical Examination Vitals:   07/05/19 1312  BP: 116/84  Pulse: (!) 155  SpO2: 97%   Filed Weights   07/05/19 1312  Weight: 225 lb (102.1 kg)    Gen: resting comfortably, no acute distress HEENT: no scleral icterus, pupils equal round and reactive, no palptable cervical adenopathy,  CV: irreg, tachy 150 Resp: Clear to  auscultation bilaterally GI: abdomen is soft, non-tender, non-distended, normal bowel sounds, no hepatosplenomegaly MSK: extremities are warm, no edema.  Skin: warm, no rash Neuro:  no focal deficits Psych: appropriate affect   Diagnostic Studies IMPRESSIONS    1. The left ventricle has normal systolic function with an ejection fraction of 60-65%. The cavity size was normal. There is focal basal septal hypertrophy. Left ventricular diastolic parameters were normal.  2. The right ventricle has normal systolic function. The cavity was normal. There is no increase in right ventricular wall thickness.  3. Left atrial size was mildly dilated.  4. No evidence of mitral valve stenosis.  5. The aortic valve is tricuspid. No stenosis of the aortic valve.  6. The aorta is normal in size and structure.  7. The aortic root is normal in size and structure.  8. Pulmonary hypertension is indeterminate, inadequate TR jet.     Assessment and Plan  1. PAF - EKG today afib RVR 150s, she is significantly symptomatic - exacerbation due to missed beta blocker yesterday, also increased systemic stress with bronchitis and also recent steroid pack all likely contributing - she will go to ER. She missed some doses of eliquis, not a DCCV candidate in ER. Last visit she did convert with IV lopressor alone, would try initially in ER. Recheck labs including her potassium levels - not a toprol failure as she missed a dose, can continue her current home dose likely   Spoke with ER staff Dr Laverta [place].     Arnoldo Lenis, M.D.

## 2019-07-05 NOTE — Patient Instructions (Signed)
Medication Instructions:    Labwork:   Testing/Procedures:   Follow-Up: Your physician recommends that you schedule a follow-up appointment in: TO BE DETERMINED    Any Other Special Instructions Will Be Listed Below (If Applicable).  SENT PT TO ED    If you need a refill on your cardiac medications before your next appointment, please call your pharmacy.

## 2019-07-05 NOTE — Discharge Instructions (Addendum)
It was a pleasure caring for you today! Please follow-up closely with your cardiologist. Be sure to take your medications as prescribed. Increase your potassium-rich foods in your diet, as your potassium level was slightly low today. Return to the emergency department if you develop recurrence of symptoms.

## 2019-07-05 NOTE — ED Triage Notes (Signed)
PT states she was at work and started feeling palpitations with SOB and was seen by Dr. Harl Bowie who did an EKG and told her to come to ED. PT unsure if she took her dose of Metoprolol and Eliquis yesterday evening.

## 2019-07-05 NOTE — ED Provider Notes (Signed)
[facility] EMERGENCY DEPARTMENT Provider Note   CSN: [id number] Arrival date & time: 07/05/19  1342     History   Chief Complaint Chief Complaint  Patient presents with  . Atrial Fibrillation    HPI Holly Krueger is a 53 y.o. female with past medical history of PAF on Eliquis, hypertension, GERD, presenting to the emergency department from Dr. Nelly Laurence office for A. fib with RVR.  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  It feels similar to her prior episode of A. fib with RVR with associated sudden onset of shortness of breath.  She walked next door to her cardiologist, Dr. Harl Bowie, who evaluated her.  Her EKG showed A. fib with RVR.  Patient states she missed her evening dose of metoprolol and Eliquis yesterday, however only 1 dose of each.  She is not due to take her metoprolol today until the evening.  She took her Eliquis this morning.  She denies associated chest pain, lightheadedness, or other associated symptoms.  She states she is currently being treated with prednisone for bronchitis.  Per Dr. Harl Bowie, patient converted during last ED visit with a dose of IV Lopressor and recommends initial attempt with IV Lopressor.     The history is provided by the patient and medical records.    Past Medical History:  Diagnosis Date  . Endometriosis   . GERD (gastroesophageal reflux disease)   . Headache(784.0)   . Hypertension   . Kidney stone   . PAF (paroxysmal atrial fibrillation) [facility])     Patient Active Problem List   Diagnosis Date Noted  . Cystocele 12/23/2013  . Perimenopausal vasomotor symptoms 03/21/2013    Past Surgical History:  Procedure Laterality Date  . ABDOMINAL HYSTERECTOMY    . APPENDECTOMY    . COLONOSCOPY N/A 10/02/2015   Procedure: COLONOSCOPY;  Surgeon: Danie Binder, MD;  Location: [facility];  Service: Endoscopy;  Laterality: N/A;  830   . FOOT SURGERY       OB History    Gravida  1   Para  1   Term       Preterm      AB      Living  1     SAB      TAB      Ectopic      Multiple      Live Births  1            Home Medications    Prior to Admission medications   Medication Sig Start Date End Date Taking? Authorizing Provider  albuterol (PROVENTIL HFA;VENTOLIN HFA) 108 (90 Base) MCG/ACT inhaler INHALE 1 TO 2 PUFFS INTO LUNGS EVERY 4 HOURS AS NEEDED 10/08/18  Yes [provider]  apixaban (ELIQUIS) 5 MG TABS tablet Take 1 tablet (5 mg total) by mouth 2 (two) times daily. 05/15/19 07/05/19 Yes Strader, Fransisco Hertz, PA-C  azaTHIOprine (IMURAN) 50 MG tablet Take 1 tablet by mouth daily. 04/16/19  Yes [provider]  gabapentin (NEURONTIN) 300 MG capsule Take 1 mg by mouth at bedtime.  12/05/18  Yes [provider]  metoprolol succinate (TOPROL-XL) 50 MG 24 hr tablet Take 1 tablet (50 mg total) by mouth daily. 05/15/19  Yes Strader, [place] M, PA-C  montelukast (SINGULAIR) 10 MG tablet Take 1 tablet by mouth daily. 03/29/19  Yes [provider]  omeprazole (PRILOSEC) 20 MG capsule TAKE 1 CAPSULE BY MOUTH ONCE DAILY 30  MINS PRIOR  TO  BREAKFAST  ON  SAT  AND  SUN Patient taking differently: Take 40 mg by mouth 2 (two) times daily before a meal. TAKE 1 CAPSULE BY MOUTH ONCE DAILY 30 MINS PRIOR  TO  BREAKFAST  ON  SAT  AND  SUN 05/09/18  Yes Gill, Eric A, NP  QUEtiapine (SEROQUEL) 50 MG tablet Take 1 tablet by mouth daily. 04/01/19  Yes [provider]  rOPINIRole (REQUIP) 1 MG tablet Take 1 mg by mouth at bedtime. 11/15/18  Yes [provider]  zolpidem (AMBIEN) 10 MG tablet Take 1 tablet (10 mg total) by mouth at bedtime as needed for sleep. Up 5 times weekly 03/21/13  Yes Jonnie Kind, MD  hydrOXYzine (VISTARIL) 25 MG capsule  06/06/16   [provider]    Family History Family History  Problem Relation Age of Onset  . Hyperlipidemia Mother   . Hypertension Father     Social History Social History   Tobacco Use   . Smoking status: Never Smoker  . Smokeless tobacco: Never Used  Substance Use Topics  . Alcohol use: No  . Drug use: No     Allergies   Codeine and Hydrocodone   Review of Systems Review of Systems  Respiratory: Positive for shortness of breath.   Cardiovascular: Positive for palpitations.  All other systems reviewed and are negative.    Physical Exam Updated Vital Signs BP 112/82   Pulse (!) 51   Temp 98.2 F (36.8 C) (Oral)   Resp 20   SpO2 96%   Physical Exam Vitals signs and nursing note reviewed.  Constitutional:      General: She is not in acute distress.    Appearance: She is well-developed.  HENT:     Head: Normocephalic and atraumatic.  Eyes:     Conjunctiva/sclera: Conjunctivae normal.  Cardiovascular:     Rate and Rhythm: Tachycardia present. Rhythm irregular.  Pulmonary:     Effort: Pulmonary effort is normal. No respiratory distress.     Breath sounds: Normal breath sounds.  Abdominal:     General: Bowel sounds are normal.     Palpations: Abdomen is soft.     Tenderness: There is no abdominal tenderness. There is no guarding or rebound.  Musculoskeletal:     Right lower leg: No edema.     Left lower leg: No edema.  Skin:    General: Skin is warm.  Neurological:     Mental Status: She is alert.  Psychiatric:        Behavior: Behavior normal.      ED Treatments / Results  Labs (all labs ordered are listed, but only abnormal results are displayed) Labs Reviewed  BASIC METABOLIC PANEL - Abnormal; Notable for the following components:      Result Value   Potassium 3.1 (*)    CO2 14 (*)    Glucose, Bld 163 (*)    Calcium 8.2 (*)    All other components within normal limits  CBC WITH DIFFERENTIAL/PLATELET - Abnormal; Notable for the following components:   Hemoglobin 10.8 (*)    HCT 35.4 (*)    RDW 17.6 (*)    Neutro Abs 9.0 (*)    All other components within normal limits  URINALYSIS, ROUTINE W REFLEX MICROSCOPIC - Abnormal;  Notable for the following components:   Color, Urine STRAW (*)    Specific Gravity, Urine 1.003 (*)    All other components within normal limits  EKG EKG Interpretation  Date/Time:  Friday July 05 2019 13:51:18 EDT Ventricular Rate:  136 PR Interval:    QRS Duration: 89 QT Interval:  333 QTC Calculation: 516 R Axis:   -34 Text Interpretation:  Atrial fibrillation Left axis deviation Borderline ST depression, diffuse leads Prolonged QT interval No STEMI  Confirmed by Nanda Quinton [id number]) on 07/05/2019 1:54:53 PM   Radiology Dg Chest Port 1 View  Result Date: 07/05/2019 CLINICAL DATA:  Shortness of breath.  Atrial fibrillation EXAM: PORTABLE CHEST 1 VIEW COMPARISON:  April 24, 2019 FINDINGS: There is no edema or consolidation. Heart is borderline enlarged with pulmonary vascularity normal. No adenopathy. No bone lesions. IMPRESSION: Borderline cardiomegaly.  No edema or consolidation. Electronically Signed   By: Lowella Grip III M.D.   On: 07/05/2019 14:21    Procedures .Critical Care Performed by: Robinson, [place] N, PA-C Authorized by: Robinson, [place] N, PA-C   Critical care provider statement:    Critical care time (minutes):  35   Critical care time was exclusive of:  Separately billable procedures and treating other patients and teaching time   Critical care was necessary to treat or prevent imminent or life-threatening deterioration of the following conditions:  Cardiac failure and circulatory failure   Critical care was time spent personally by me on the following activities:  Evaluation of patient's response to treatment, examination of patient, ordering and performing treatments and interventions, ordering and review of laboratory studies, ordering and review of radiographic studies, pulse oximetry, re-evaluation of patient's condition, obtaining history from patient or surrogate and review of old charts   I assumed direction of critical care for this patient from  another provider in my specialty: no     (including critical care time)  Medications Ordered in ED Medications  metoprolol succinate (TOPROL-XL) 24 hr tablet 50 mg (has no administration in time range)  metoprolol tartrate (LOPRESSOR) injection 10 mg (10 mg Intravenous Given 07/05/19 1409)  sodium chloride 0.9 % bolus 1,000 mL (1,000 mLs Intravenous New Bag/Given 07/05/19 1531)  metoprolol tartrate (LOPRESSOR) injection 5 mg (5 mg Intravenous Given 07/05/19 1537)  potassium chloride SA (K-DUR) CR tablet 40 mEq (40 mEq Oral Given 07/05/19 1537)     Initial Impression / Assessment and Plan / ED Course  I have reviewed the triage vital signs and the nursing notes.  Pertinent labs & imaging results that were available during my care of the patient were reviewed by me and considered in my medical decision making (see chart for details).  Clinical Course as of Jul 04 1722  Fri Jul 05, 2019  1523 Patient still in A. fib though rates of low 100s.  Discussed with Dr. Laverta Baltimore.  Will administer second dose of Lopressor as well as IV fluids to maintain BP.   [JR]  [id number] Pt is in sinus rhythm with rate in the 70s. Will monitor for some time longer to ensure pt remains SR with normal rate with anticipated discharge. Will give home dose of metoprolol per Dr. Laverta Baltimore.    [JR]  [id number] Pt appears to remain in NSR with rate in the 70s. Will obtain recheck EKG, with plan for discharge   [JR]    Clinical Course User Index [JR] Robinson, [place] N, PA-C       Patient presenting from cardiology office for A. fib with RVR.  She missed her evening dose of Eliquis and metoprolol yesterday.  She has associated shortness of breath though no chest pain.  Patient is in  A. fib with RVR on evaluation, with some SOB.  Will administer IV Lopressor per cardiology recommendation.  Patient is not a candidate for cardioversion as she missed a dose of Eliquis yesterday.  Patient administered 10 mg of IV Lopressor some improvement in  rate, though patient remains in A. fib in the low 100s.  Will re-dose with 5 mg and provide IV fluids to maintain blood pressures.  Symptoms are improving. Potassium slightly low at 3.1, replaced orally.  After 5 mg of Lopressor, patient has converted to sinus rhythm and has remained in sinus rhythm for at least 1 hour with rates in the 70s.  Her symptoms have resolved.  This is not patient's first occurrence of A. fib with RVR, had recent workup in July for this, and she is followed closely by Dr. Harl Bowie with cardiology. I believe she can be discharged home. She remains in normal sinus rhythm and is asymptomatic.  Encouraged she not miss any doses of her Eliquis follow-up closely with cardiology.  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  Patient verbalized understanding agrees with care plan for discharge.  Patient discussed with Dr. Laverta Baltimore, agrees with work-up and plan for discharge.  Final Clinical Impressions(s) / ED Diagnoses   Final diagnoses:  Atrial fibrillation with RVR [facility])    ED Discharge Orders    None       Robinson, [place] N, PA-C 07/05/19 1724    Margette Fast, MD 07/05/19 1952

## 2019-08-09 ENCOUNTER — Telehealth: Payer: 59 | Admitting: Nurse Practitioner

## 2019-08-09 DIAGNOSIS — T753XXA Motion sickness, initial encounter: Secondary | ICD-10-CM

## 2019-08-09 MED ORDER — ONDANSETRON HCL 4 MG PO TABS: 4.0000 mg | ORAL_TABLET | Freq: Three times a day (TID) | ORAL | 0 refills | Status: AC | PRN

## 2019-08-09 MED ORDER — MECLIZINE HCL 25 MG PO TABS: 25.0000 mg | ORAL_TABLET | Freq: Three times a day (TID) | ORAL | 0 refills | Status: DC | PRN

## 2019-08-09 NOTE — Progress Notes (Signed)
E Visit for Motion Sickness  We are sorry that you are not feeling well. Here is how we plan to help!  Based on what you have shared with me it looks like you have symptoms of motion sickness.  I have prescribed a medication that will help prevent or alleviate your symptoms:  Meclizine 25mg  by mouth three times per day as needed for nausea/motion sickness.  I have also prescribed Ondansetron 4mg  tablet by mouth 3 times daily as needed for nausea.   Prevention:  You might feel better if you keep your eyes focused on outside while you are in motion. For example, if you are in a car, sit in the front and look in the direction you are moving; if you are on a boat, stay on the deck and look to the horizon. This helps make what you see match the movement you are feeling, and so you are less likely to feel sick.  You should also avoid reading, watching a movie, texting or reading messages, or looking at things close to you inside the vehicle you are riding in.  . Use the seat head rest. Lean your head against the back of the seat or head rest when traveling in vehicles with seats to minimize head movements.  . On a ship: When making your reservations, choose a cabin in the middle of the ship and near the waterline. When on board, go up on deck and focus on the horizon.  . In an airplane: Request a window seat and look out the window. A seat over the front edge of the wing is the most preferable spot (the degree of motion is the lowest here). Direct the air vent to blow cool air on your face.  . On a train: Always face forward and sit near a window.  . In a vehicle: Sit in the front seat; if you are the passenger, look at the scenery in the distance. For some people, driving the vehicle (rather than being a passenger) is an instant remedy.  . Avoid others who have become nauseous with motion sickness. Seeing and smelling others who have motion sickness may cause you to become sick.  GET HELP  RIGHT AWAY IF:   Your symptoms do not improve or worsen within 2 days after treatment.   You cannot keep down fluids after trying the medication.   Other associated symptoms such as severe headache, visual field changes, fever, or intractable nausea and vomiting.  MAKE SURE YOU:   Understand these instructions.  Will watch your condition.  Will get help right away if you are not doing well or get worse.  Thank you for choosing an e-visit.  Your e-visit answers were reviewed by a board certified advanced clinical practitioner to complete your personal care plan. Depending upon the condition, your plan could have included both over the counter or prescription medications.  Please review your pharmacy choice. Be sure that the pharmacy you have chosen is open so that you can pick up your prescription now.  If there is a problem you may message your provider in [place] to have the prescription routed to another pharmacy.  Your safety is important to [place]. If you have drug allergies check your prescription carefully.   For the next 24 hours, you can use MyChart to ask questions about today's visit, request a non-urgent call back, or ask for a work or school excuse from your e-visit provider.  You will get an e-mail in the next  two days asking about your experience. I hope that your e-visit has been valuable and will speed your recovery.   References or for more information: ThenWeb.com.ee https://my.[employer]s.be https://www.uptodate.com  I have spent 5 to 10 minutes reviewing and documenting in the patient's chart.

## 2019-08-28 MED FILL — QUETIAPINE FUMARATE 50 MG T: 50 | 30 days supply | Qty: 30 | Fill #3

## 2019-09-03 DIAGNOSIS — H35063 Retinal vasculitis, bilateral: Secondary | ICD-10-CM | POA: Diagnosis not present

## 2019-09-03 DIAGNOSIS — H30033 Focal chorioretinal inflammation, peripheral, bilateral: Secondary | ICD-10-CM | POA: Diagnosis not present

## 2019-09-03 DIAGNOSIS — H2513 Age-related nuclear cataract, bilateral: Secondary | ICD-10-CM | POA: Diagnosis not present

## 2019-09-03 DIAGNOSIS — H3581 Retinal edema: Secondary | ICD-10-CM | POA: Diagnosis not present

## 2019-09-03 DIAGNOSIS — H35033 Hypertensive retinopathy, bilateral: Secondary | ICD-10-CM | POA: Diagnosis not present

## 2019-09-03 MED FILL — azaTHIOprine 50 MG TABS: 50 | 90 days supply | Qty: 180 | Fill #0

## 2019-09-05 MED FILL — ELIQUIS 5 MG TABLET: 5 | 90 days supply | Qty: 180 | Fill #1

## 2019-09-05 MED FILL — METOPROLOL SUCCINATE ER 50: 50 | 90 days supply | Qty: 90 | Fill #1

## 2019-09-20 ENCOUNTER — Other Ambulatory Visit (HOSPITAL_COMMUNITY)
Admission: RE | Admit: 2019-09-20 | Discharge: 2019-09-20 | Disposition: A | Discharge status: Home / Self Care | Payer: 59 | Source: Ambulatory Visit | Attending: Ophthalmology | Admitting: Ophthalmology

## 2019-09-20 DIAGNOSIS — Z5181 Encounter for therapeutic drug level monitoring: Secondary | ICD-10-CM | POA: Insufficient documentation

## 2019-09-20 DIAGNOSIS — Z79899 Other long term (current) drug therapy: Secondary | ICD-10-CM | POA: Insufficient documentation

## 2019-09-20 LAB — CBC WITH DIFFERENTIAL/PLATELET
Abs Immature Granulocytes: 0.03 10*3/uL (ref 0.00–0.07)
Basophils Absolute: 0.1 10*3/uL (ref 0.0–0.1)
Basophils Relative: 1 %
Eosinophils Absolute: 0.1 10*3/uL (ref 0.0–0.5)
Eosinophils Relative: 2 %
HCT: 31.8 % — ABNORMAL LOW (ref 36.0–46.0)
Hemoglobin: 9.8 g/dL — ABNORMAL LOW (ref 12.0–15.0)
Immature Granulocytes: 1 %
Lymphocytes Relative: 23 %
Lymphs Abs: 1.4 10*3/uL (ref 0.7–4.0)
MCH: 28.6 pg (ref 26.0–34.0)
MCHC: 30.8 g/dL (ref 30.0–36.0)
MCV: 92.7 fL (ref 80.0–100.0)
Monocytes Absolute: 0.4 10*3/uL (ref 0.1–1.0)
Monocytes Relative: 7 %
Neutro Abs: 4 10*3/uL (ref 1.7–7.7)
Neutrophils Relative %: 66 %
Platelets: 315 10*3/uL (ref 150–400)
RBC: 3.43 MIL/uL — ABNORMAL LOW (ref 3.87–5.11)
RDW: 18.3 % — ABNORMAL HIGH (ref 11.5–15.5)
WBC: 6 10*3/uL (ref 4.0–10.5)
nRBC: 0 % (ref 0.0–0.2)

## 2019-09-20 LAB — COMPREHENSIVE METABOLIC PANEL
ALT: 15 U/L (ref 0–44)
AST: 16 U/L (ref 15–41)
Albumin: 3.8 g/dL (ref 3.5–5.0)
Alkaline Phosphatase: 95 U/L (ref 38–126)
Anion gap: 7 (ref 5–15)
BUN: 9 mg/dL (ref 6–20)
CO2: 23 mmol/L (ref 22–32)
Calcium: 8.6 mg/dL — ABNORMAL LOW (ref 8.9–10.3)
Chloride: 110 mmol/L (ref 98–111)
Creatinine, Ser: 0.98 mg/dL (ref 0.44–1.00)
GFR calc Af Amer: >60 mL/min (ref >60)
GFR calc non Af Amer: >60 mL/min (ref >60)
Glucose, Bld: 99 mg/dL (ref 70–99)
Potassium: 3.9 mmol/L (ref 3.5–5.1)
Sodium: 140 mmol/L (ref 135–145)
Total Bilirubin: 0.6 mg/dL (ref 0.3–1.2)
Total Protein: 6.2 g/dL — ABNORMAL LOW (ref 6.5–8.1)

## 2019-10-14 MED FILL — GABAPENTIN 300 MG CAPSULE: 300 | 90 days supply | Qty: 90 | Fill #1

## 2019-10-15 DIAGNOSIS — J042 Acute laryngotracheitis: Secondary | ICD-10-CM | POA: Diagnosis not present

## 2019-10-15 DIAGNOSIS — R69 Illness, unspecified: Secondary | ICD-10-CM | POA: Diagnosis not present

## 2019-10-15 DIAGNOSIS — Z681 Body mass index (BMI) 19 or less, adult: Secondary | ICD-10-CM | POA: Diagnosis not present

## 2019-10-15 DIAGNOSIS — K219 Gastro-esophageal reflux disease without esophagitis: Secondary | ICD-10-CM | POA: Diagnosis not present

## 2019-10-19 ENCOUNTER — Other Ambulatory Visit: Payer: Self-pay | Admitting: Nurse Practitioner

## 2019-10-19 DIAGNOSIS — T753XXA Motion sickness, initial encounter: Secondary | ICD-10-CM

## 2019-10-24 MED FILL — QUETIAPINE FUMARATE 50 MG T: 50 | 30 days supply | Qty: 30 | Fill #0

## 2019-10-25 ENCOUNTER — Telehealth (INDEPENDENT_AMBULATORY_CARE_PROVIDER_SITE_OTHER): Payer: 59 | Admitting: Student

## 2019-10-25 ENCOUNTER — Encounter: Payer: Self-pay | Admitting: Student

## 2019-10-25 VITALS — BP 97/67 | HR 94 | Wt 232.0 lb

## 2019-10-25 DIAGNOSIS — Z7901 Long term (current) use of anticoagulants: Secondary | ICD-10-CM | POA: Diagnosis not present

## 2019-10-25 DIAGNOSIS — Z7189 Other specified counseling: Secondary | ICD-10-CM

## 2019-10-25 DIAGNOSIS — U071 COVID-19: Secondary | ICD-10-CM | POA: Diagnosis not present

## 2019-10-25 DIAGNOSIS — I480 Paroxysmal atrial fibrillation: Secondary | ICD-10-CM | POA: Diagnosis not present

## 2019-10-25 DIAGNOSIS — I10 Essential (primary) hypertension: Secondary | ICD-10-CM

## 2019-10-25 NOTE — Patient Instructions (Signed)
Medication Instructions:  Your physician recommends that you continue on your current medications as directed. Please refer to the Current Medication list given to you today.   Labwork: NONE   Testing/Procedures: NONE  Follow-Up: Your physician wants you to follow-up in: 6 Months with Dr. Branch. You will receive a reminder letter in the mail two months in advance. If you don't receive a letter, please call our office to schedule the follow-up appointment.   Any Other Special Instructions Will Be Listed Below (If Applicable).     If you need a refill on your cardiac medications before your next appointment, please call your pharmacy. Thank you for choosing [facility]!    

## 2019-10-25 NOTE — Progress Notes (Signed)
Virtual Visit via Telephone Note   This visit type was conducted due to national recommendations for restrictions regarding the COVID-19 Pandemic (e.g. social distancing) in an effort to limit this patient's exposure and mitigate transmission in our community.  Due to her co-morbid illnesses, this patient is at least at moderate risk for complications without adequate follow up.  This format is felt to be most appropriate for this patient at this time.  The patient did not have access to video technology/had technical difficulties with video requiring transitioning to audio format only (telephone).  All issues noted in this document were discussed and addressed.  No physical exam could be performed with this format.  Please refer to the patient's chart for her  consent to telehealth for [facility].   Date:  10/25/2019   ID:  Holly Krueger, DOB Oct 23, 1965, MRN [id number]  Patient Location: Home Provider Location: Office  PCP:  Sharilyn Sites, MD  Cardiologist:  Carlyle Dolly, MD  Electrophysiologist:  None   Evaluation Performed:  Follow-Up Visit  Chief Complaint: 6 month follow-up  History of Present Illness:    Holly Krueger is a 54 y.o. female with past medical history of paroxysmal atrial fibrillation, HTN and GERD who presents for a 62-month follow-up telehealth visit.  She was last examined by Dr. Harl Bowie in 06/2019 and had overall done well but had recently missed a dose of Lopressor and had been on steroids given acute bronchitis. Had developed acute dyspnea and palpitations, therefore she was sent to the ED for further evaluation. She was found to be in recurrent atrial fibrillation and IV Lopressor was administered with return to normal sinus rhythm while in the ED. She was continued on Toprol-XL 50 mg daily and Eliquis 5 mg twice daily.  In talking with the patient today, she reports that she was recently diagnosed with COVID-19 on 10/21/2019. She had been evaluated by her PCP  the week prior and was thought to have bronchitis as testing was negative at that time and she was started on antibiotics and steroids. On 10/18/2019, she lost her sense of taste and smell, therefore she was retested and found to be positive. She reports having dyspnea and a dry cough but her oxygen saturations have overall remained stable at home. Had intermittent episodes of a low-grade fever but denies any recent symptoms. She feels like she is starting to improve. She has quarantined herself into a bedroom in her home to reduce risk of exposure to family.  She denies any recent chest pain or palpitations. Heart rate has been well controlled in the 80's to 90's when checked at home. She reports good compliance with her current medication regimen and denies missing any recent doses.    Past Medical History:  Diagnosis Date  . Endometriosis   . GERD (gastroesophageal reflux disease)   . Headache(784.0)   . Hypertension   . Kidney stone   . PAF (paroxysmal atrial fibrillation) ([place])    Past Surgical History:  Procedure Laterality Date  . ABDOMINAL HYSTERECTOMY    . APPENDECTOMY    . COLONOSCOPY N/A 10/02/2015   Procedure: COLONOSCOPY;  Surgeon: Danie Binder, MD;  Location[facility];  Service: Endoscopy;  Laterality: N/A;  830   . FOOT SURGERY       Current Meds  Medication Sig  . apixaban (ELIQUIS) 5 MG TABS tablet Take 1 tablet (5 mg total) by mouth 2 (two) times daily.  Marland Kitchen azaTHIOprine (IMURAN) 50 MG tablet  Take 1 tablet by mouth daily.  Marland Kitchen gabapentin (NEURONTIN) 300 MG capsule Take 1 mg by mouth at bedtime.   . metoprolol succinate (TOPROL-XL) 50 MG 24 hr tablet Take 1 tablet (50 mg total) by mouth daily.  Marland Kitchen omeprazole (PRILOSEC) 20 MG capsule TAKE 1 CAPSULE BY MOUTH ONCE DAILY 30 MINS PRIOR  TO  BREAKFAST  ON  SAT  AND  SUN (Patient taking differently: Take 40 mg by mouth 2 (two) times daily before a meal. TAKE 1 CAPSULE BY MOUTH ONCE DAILY 30 MINS PRIOR  TO  BREAKFAST  ON  SAT   AND  SUN)  . QUEtiapine (SEROQUEL) 50 MG tablet Take 1 tablet by mouth daily.  Marland Kitchen rOPINIRole (REQUIP) 1 MG tablet Take 1 mg by mouth at bedtime.  Marland Kitchen zolpidem (AMBIEN) 10 MG tablet Take 1 tablet (10 mg total) by mouth at bedtime as needed for sleep. Up 5 times weekly     Allergies:   Codeine and Hydrocodone   Social History   Tobacco Use  . Smoking status: Never Smoker  . Smokeless tobacco: Never Used  Substance Use Topics  . Alcohol use: No  . Drug use: No     Family Hx: The patient's family history includes Hyperlipidemia in her mother; Hypertension in her father.  ROS:   Please see the history of present illness.     All other systems reviewed and are negative.   Prior CV studies:   The following studies were reviewed today:  Echocardiogram: 04/2019 IMPRESSIONS    1. The left ventricle has normal systolic function with an ejection fraction of 60-65%. The cavity size was normal. There is focal basal septal hypertrophy. Left ventricular diastolic parameters were normal.  2. The right ventricle has normal systolic function. The cavity was normal. There is no increase in right ventricular wall thickness.  3. Left atrial size was mildly dilated.  4. No evidence of mitral valve stenosis.  5. The aortic valve is tricuspid. No stenosis of the aortic valve.  6. The aorta is normal in size and structure.  7. The aortic root is normal in size and structure.  8. Pulmonary hypertension is indeterminate, inadequate TR jet.  Labs/Other Tests and Data Reviewed:    EKG:  An ECG dated 07/05/2019 was personally reviewed today and demonstrated: NSR, HR 63 with no acute ST abnormalities. Prior tracing from earlier that day showed atrial fibrillation with RVR.   Recent Labs: 04/24/2019: Magnesium 1.9; TSH 1.265 09/20/2019: ALT 15; BUN 9; Creatinine, Ser 0.98; Hemoglobin 9.8; Platelets 315; Potassium 3.9; Sodium 140   Recent Lipid Panel No results found for: CHOL, TRIG, HDL, CHOLHDL,  LDLCALC, LDLDIRECT  Wt Readings from Last 3 Encounters:  10/25/19 232 lb (105.2 kg)  07/05/19 225 lb (102.1 kg)  05/15/19 233 lb (105.7 kg)     Objective:    Vital Signs:  BP 97/67   Pulse 94   Wt 232 lb (105.2 kg)   BMI 39.82 kg/m    General: Pleasant female sounding in NAD.  Psych: Normal affect. Neuro: Alert and oriented X 3.  Lungs:  Resp regular and unlabored. Intermittent coughing noted.   ASSESSMENT & PLAN:    1. Paroxysmal Atrial Fibrillation - She denies any recent palpitations and heart rate has overall been well controlled at home, even in the setting of her testing positive for COVID-19. - Given her well-controlled heart rate and low-normal BP, will continue Toprol-XL at current dosing of 50 mg daily. - She denies  any evidence of active bleeding. Continue Eliquis 5 mg twice daily for anticoagulation.  2. HTN - BP soft at 97/67 during today's visit but she denies any dizziness or presyncope. She has been less active as she is quarantined in her bedroom since testing positive for COVID. Continue Toprol-XL 50mg  daily.   3.COVID-19 - She reports her dyspnea has improved and she is now afebrile. She does still have a lingering dry cough.  Was encouraged to continue to monitor her HR and O2 saturations.    COVID-19 Education: The signs and symptoms of COVID-19 were discussed with the patient and how to seek care for testing (follow up with PCP or arrange E-visit).  Patient currently COVID positive.   Time:   Today, I have spent 14 minutes with the patient with telehealth technology discussing the above problems.     Medication Adjustments/Labs and Tests Ordered: Current medicines are reviewed at length with the patient today.  Concerns regarding medicines are outlined above.   Tests Ordered: No orders of the defined types were placed in this encounter.   Medication Changes: No orders of the defined types were placed in this encounter.   Follow Up:  In Person  in 6 month(s)  Signed, Erma Heritage, PA-C  10/25/2019 3:15 PM    [facility]

## 2019-10-29 ENCOUNTER — Telehealth: Payer: Self-pay | Admitting: Student

## 2019-10-29 NOTE — Telephone Encounter (Signed)
Pt wanted B. Strader to be made aware that they started her on another Steroid pack due to COVID

## 2019-10-29 NOTE — Telephone Encounter (Signed)
     Thank you for the update. If no cardiac symptoms, would continue her current medication regimen with Toprol-XL 50 mg daily and Eliquis. If she does experience palpitations, would recommend checking HR and BP as she could take an extra half tablet of Toprol-XL if needed as long as vitals are stable.   Signed, Erma Heritage, PA-C 10/29/2019, 4:56 PM Pager: [phone]

## 2019-10-30 MED FILL — IVERMECTIN 3 MG TABLET: 3 | 3 days supply | Qty: 21 | Fill #0

## 2019-10-30 NOTE — Telephone Encounter (Signed)
Sent pt MyChart message.

## 2019-11-07 DIAGNOSIS — Z681 Body mass index (BMI) 19 or less, adult: Secondary | ICD-10-CM | POA: Diagnosis not present

## 2019-11-07 DIAGNOSIS — U071 COVID-19: Secondary | ICD-10-CM | POA: Diagnosis not present

## 2019-12-10 MED FILL — ELIQUIS 5 MG TABLET: 5 | 90 days supply | Qty: 180 | Fill #2

## 2019-12-10 MED FILL — METOPROLOL SUCCINATE ER 50: 50 | 90 days supply | Qty: 90 | Fill #2

## 2019-12-13 MED FILL — QUETIAPINE FUMARATE 50 MG T: 50 | 30 days supply | Qty: 30 | Fill #1

## 2020-01-30 ENCOUNTER — Other Ambulatory Visit (HOSPITAL_COMMUNITY): Payer: Self-pay | Admitting: Internal Medicine

## 2020-01-30 ENCOUNTER — Other Ambulatory Visit: Payer: Self-pay

## 2020-01-30 ENCOUNTER — Ambulatory Visit (HOSPITAL_COMMUNITY)
Admission: RE | Admit: 2020-01-30 | Discharge: 2020-01-30 | Disposition: A | Discharge status: Home / Self Care | Payer: 59 | Source: Ambulatory Visit | Attending: Internal Medicine | Admitting: Internal Medicine

## 2020-01-30 DIAGNOSIS — R05 Cough: Secondary | ICD-10-CM | POA: Diagnosis not present

## 2020-01-30 DIAGNOSIS — R059 Cough, unspecified: Secondary | ICD-10-CM

## 2020-02-20 ENCOUNTER — Telehealth: Payer: Self-pay | Admitting: Gastroenterology

## 2020-02-20 MED ORDER — PROMETHAZINE HCL 12.5 MG PO TABS: ORAL_TABLET | ORAL | 3 refills | Status: DC

## 2020-02-20 MED ORDER — LIDOCAINE VISCOUS HCL 2 % MT SOLN: OROMUCOSAL | 5 refills | Status: DC

## 2020-02-20 MED ORDER — FAMOTIDINE 20 MG PO TABS: ORAL_TABLET | ORAL | 11 refills | Status: DC

## 2020-02-20 NOTE — Telephone Encounter (Signed)
PT HAVING UNCONTROLLED HEARTBURN ON BID PPI. RX FOR PRN PEPCID, VISCOUS LIDOCAINE, AND PHENERGAN SENT.

## 2020-03-04 ENCOUNTER — Other Ambulatory Visit (HOSPITAL_COMMUNITY): Payer: Self-pay | Admitting: Physician Assistant

## 2020-03-04 DIAGNOSIS — Z1231 Encounter for screening mammogram for malignant neoplasm of breast: Secondary | ICD-10-CM

## 2020-03-30 ENCOUNTER — Ambulatory Visit (HOSPITAL_COMMUNITY)
Admission: RE | Admit: 2020-03-30 | Discharge: 2020-03-30 | Disposition: A | Discharge status: Home / Self Care | Payer: 59 | Source: Ambulatory Visit | Attending: Physician Assistant | Admitting: Physician Assistant

## 2020-03-30 ENCOUNTER — Other Ambulatory Visit: Payer: Self-pay

## 2020-03-30 DIAGNOSIS — Z1231 Encounter for screening mammogram for malignant neoplasm of breast: Secondary | ICD-10-CM | POA: Diagnosis not present

## 2020-06-18 ENCOUNTER — Ambulatory Visit (INDEPENDENT_AMBULATORY_CARE_PROVIDER_SITE_OTHER): Payer: 59 | Admitting: Family Medicine

## 2020-06-18 ENCOUNTER — Encounter (INDEPENDENT_AMBULATORY_CARE_PROVIDER_SITE_OTHER): Payer: Self-pay | Admitting: Family Medicine

## 2020-06-18 ENCOUNTER — Other Ambulatory Visit: Payer: Self-pay

## 2020-06-18 VITALS — BP 142/64 | HR 74 | Temp 97.8°F | Ht 63.0 in | Wt 244.0 lb

## 2020-06-18 DIAGNOSIS — Z0289 Encounter for other administrative examinations: Secondary | ICD-10-CM

## 2020-06-18 DIAGNOSIS — R0602 Shortness of breath: Secondary | ICD-10-CM | POA: Diagnosis not present

## 2020-06-18 DIAGNOSIS — Z1331 Encounter for screening for depression: Secondary | ICD-10-CM | POA: Diagnosis not present

## 2020-06-18 DIAGNOSIS — I10 Essential (primary) hypertension: Secondary | ICD-10-CM

## 2020-06-18 DIAGNOSIS — I4891 Unspecified atrial fibrillation: Secondary | ICD-10-CM

## 2020-06-18 DIAGNOSIS — E66813 Obesity, class 3: Secondary | ICD-10-CM

## 2020-06-18 DIAGNOSIS — R5383 Other fatigue: Secondary | ICD-10-CM

## 2020-06-18 DIAGNOSIS — Z6841 Body Mass Index (BMI) 40.0 and over, adult: Secondary | ICD-10-CM

## 2020-06-18 NOTE — Progress Notes (Signed)
Office: [phone]  /  Fax: [phone]    Date: June 23, 2020   Appointment Start Time: 2:55pm Duration: 31 minutes Provider: Glennie Isle, Psy.D. Type of Session: Intake for Individual Therapy  Location of Patient: Work Location of Provider: Provider's Home Type of Contact: Telepsychological Visit via MyChart Video Visit  Informed Consent: Prior to proceeding with today's appointment, two pieces of identifying information were obtained. In addition, Biddie's physical location at the time of this appointment was obtained as well a phone number she could be reached at in the event of technical difficulties. Adabelle and this provider participated in today's telepsychological service.   The provider's role was explained to [employer]. The provider reviewed and discussed issues of confidentiality, privacy, and limits therein (e.g., reporting obligations). In addition to verbal informed consent, written informed consent for psychological services was obtained prior to the initial appointment. Since the clinic is not a 24/7 crisis center, mental health emergency resources were shared and this  provider explained MyChart, e-mail, voicemail, and/or other messaging systems should be utilized only for non-emergency reasons. This provider also explained that information obtained during appointments will be placed in Montine's medical record and relevant information will be shared with other providers at [facility] for coordination of care. Moreover, Jermiah agreed information may be shared with other [facility] providers as needed for coordination of care. By signing the service agreement document, Riyanna provided written consent for coordination of care. Prior to initiating telepsychological services, Adilee completed an informed consent document, which included the development of a safety plan (i.e., an emergency contact, nearest emergency room, and emergency resources)  in the event of an emergency/crisis. Soliyana expressed understanding of the rationale of the safety plan. Naiyah verbally acknowledged understanding she is ultimately responsible for understanding her insurance benefits for telepsychological and in-person services. This provider also reviewed confidentiality, as it relates to telepsychological services, as well as the rationale for telepsychological services (i.e., to reduce exposure risk to COVID-19). Lisett  acknowledged understanding that appointments cannot be recorded without both party consent and she is aware she is responsible for securing confidentiality on her end of the session. Marieelena verbally consented to proceed.  Of note, today's appointment was switched to a regular telephone call at 2:58pm with Sevilla's verbal consent due to technical issues.   Chief Complaint/HPI: Merilee was referred by Dr. Dennard Nip. Amour's Food and Mood (modified PHQ-9) score on June 18, 2020 was 3.  During today's appointment, Sharlisa was verbally administered a questionnaire assessing various behaviors related to emotional eating. Tyreanna did not endorse any items. She shared she craves chocolate occasionally. In addition, Zayda denied a history of binge eating. Diala denied a history of restricting food intake, purging and engagement in other compensatory strategies, and has never been diagnosed with an eating disorder. She also denied a history of treatment for emotional eating. She described a history of occasionally skipping meals (e.g., breakfast and dinner). Hadiya explained she gained weight after a hysterectomy (approximately 19 years ago) and believes she began skipping meals some time after that. Currently, Llana reported it is going "good" with the structured meal plan, adding, "I'm doing the best I can."  Furthermore, Chelesea denied other problems of concern.    Mental Status Examination:  Appearance: well groomed and appropriate hygiene    Behavior: appropriate to circumstances Mood: euthymic Affect: mood congruent Speech: normal in rate, volume, and tone Eye Contact: appropriate Psychomotor Activity: appropriate Gait: unable to assess Thought  Process: linear, logical, and goal directed  Thought Content/Perception: denies suicidal and homicidal ideation, plan, and intent and no hallucinations, delusions, bizarre thinking or behavior reported or observed Orientation: time, person, place, and purpose of appointment Memory/Concentration: memory, attention, language, and fund of knowledge intact  Insight/Judgment: fair  Family & Psychosocial History: Janei reported she is married and she has an adult daughter. 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 Additionally, Finnleigh shared her highest level of education obtained is an associate's degree. Currently, Miku's social support system consists of her husband and daughter. Moreover, Melaina stated she resides with her husband.   Medical History:  Past Medical History:  Diagnosis Date  . Atrial fibrillation ([place])   . Endometriosis   . GERD (gastroesophageal reflux disease)   . Headache(784.0)   . Hypertension   . Kidney stone   . PAF (paroxysmal atrial fibrillation) ([place])   . SOB (shortness of breath)   . Swelling of both lower extremities    Past Surgical History:  Procedure Laterality Date  . ABDOMINAL HYSTERECTOMY    . APPENDECTOMY    . COLONOSCOPY N/A 10/02/2015   Procedure: COLONOSCOPY;  Surgeon: Danie Binder, MD;  Location: [facility];  Service: Endoscopy;  Laterality: N/A;  830   . FOOT SURGERY    . NASAL SINUS SURGERY  2014   Current Outpatient Medications on File Prior to Visit  Medication Sig Dispense Refill  . famotidine (PEPCID) 20 MG tablet 1 PO BID PRN FOR UNCONTROLLED HEARTBURN 60 tablet 11  . gabapentin (NEURONTIN) 300 MG capsule Take 1 mg by mouth at bedtime.     . metoprolol succinate  (TOPROL-XL) 50 MG 24 hr tablet Take 1 tablet (50 mg total) by mouth daily. 90 tablet 3  . omeprazole (PRILOSEC) 20 MG capsule TAKE 1 CAPSULE BY MOUTH ONCE DAILY 30 MINS PRIOR  TO  BREAKFAST  ON  SAT  AND  SUN (Patient taking differently: Take 40 mg by mouth 2 (two) times daily before a meal. TAKE 1 CAPSULE BY MOUTH ONCE DAILY 30 MINS PRIOR  TO  BREAKFAST  ON  SAT  AND  SUN) 30 capsule 11  . promethazine (PHENERGAN) 12.5 MG tablet 1-2 po q4-6 h prn nausea or vomiting 60 tablet 3  . QUEtiapine (SEROQUEL) 50 MG tablet Take 1 tablet by mouth daily.    Marland Kitchen rOPINIRole (REQUIP) 1 MG tablet Take 1 mg by mouth at bedtime.    Marland Kitchen zolpidem (AMBIEN) 10 MG tablet Take 1 tablet (10 mg total) by mouth at bedtime as needed for sleep. Up 5 times weekly 30 tablet 5   No current facility-administered medications on file prior to visit.  Wendelin denied a history of head injuries and loss of consciousness.    Mental Health History: Niccole denied a history of therapeutic services. Currently, Shoshannah reported she is prescribed Seroquel and Ambien by her PCP due to sleep issues since having her hysterectomy. Endora reported there is no history of hospitalizations for psychiatric concerns. Josceline denied a family history of mental health related concerns. Aaima reported there is no history of trauma including psychological, physical  and sexual abuse, as well as neglect.   Sonjia described her typical mood lately as "always happy, nothing ever really bothers me." Lavonda denied alcohol use.  She denied tobacco use. She denied illicit/recreational substance use. She reported she has ceased drinking Pepsi since meeting with Dr. Leafy Ro and denied any other caffeine intake. Furthermore, Helene Kelp  indicated she is not experiencing the following: hallucinations and delusions, paranoia, symptoms of mania , social withdrawal, crying spells, panic attacks and decreased motivation. She also denied history of and current suicidal ideation, plan,  and intent; history of and current homicidal ideation, plan, and intent; and history of and current engagement in self-harm.  The following strengths were reported by Helene Kelp: get along with people well and willing to help others. The following strengths were observed by this provider: ability to express thoughts and feelings during the therapeutic session and ability to engage in reciprocal conversation.   Legal History: Edwin reported there is no history of legal involvement.   Structured Assessments Results: The Patient Health Questionnaire-9 (PHQ-9) is a self-report measure that assesses symptoms and severity of depression over the course of the last two weeks. Nevia obtained a score of 4 suggesting minimal depression. Saydee finds the endorsed symptoms to be not difficult at all. [0= Not at all; 1= Several days; 2= More than half the days; 3= Nearly every day] Little interest or pleasure in doing things 0  Feeling down, depressed, or hopeless 0  Trouble falling or staying asleep, or sleeping too much 3  Feeling tired or having little energy 1  Poor appetite or overeating 0  Feeling bad about yourself --- or that you are a failure or have let yourself or your family down 0  Trouble concentrating on things, such as reading the newspaper or watching television 0  Moving or speaking so slowly that other people could have noticed? Or the opposite --- being so fidgety or restless that you have been moving around a lot more than usual 0  Thoughts that you would be better off dead or hurting yourself in some way 0  PHQ-9 Score 4    The Generalized Anxiety Disorder-7 (GAD-7) is a brief self-report measure that assesses symptoms of anxiety over the course of the last two weeks. Aika obtained a score of 0. [0= Not at all; 1= Several days; 2= Over half the days; 3= Nearly every day] Feeling nervous, anxious, on edge 0  Not being able to stop or control worrying 0  Worrying too much about different  things 0  Trouble relaxing 0  Being so restless that it's hard to sit still 0  Becoming easily annoyed or irritable 0  Feeling afraid as if something awful might happen 0  GAD-7 Score 0   Interventions:  Conducted a chart review Focused on rapport building Verbally administered PHQ-9 and GAD-7 for symptom monitoring Verbally administered Food & Mood questionnaire to assess various behaviors related to emotional eating Provided emphatic reflections and validation Psychoeducation provided regarding physical versus emotional hunger Psychoeducation provided regarding eating regularly  Provisional DSM-5 Diagnosis(es): 307.59 (F50.8) Other Specified Feeding or Eating Disorder, Skipping Meals   Plan: Lasandra declined future appointments with this provider, noting "I think I'll be okay." She acknowledged understanding that she may request a follow-up appointment with this provider in the future as long as she is still established with the clinic. During today's appointment, psychoeducation regarding the importance of eating regularly was provided. Additionally, Rachael will be sent a handout via e-mail to increase awareness of hunger patterns and subsequent eating. Alisabeth provided verbal consent during today's appointment for this provider to send the handout via e-mail. No further follow-up planned by this provider.

## 2020-06-19 LAB — CBC WITH DIFFERENTIAL/PLATELET
Basophils Absolute: 0.1 10*3/uL (ref 0.0–0.2)
Basos: 1 %
EOS (ABSOLUTE): 0.2 10*3/uL (ref 0.0–0.4)
Eos: 4 %
Hematocrit: 36.9 % (ref 34.0–46.6)
Hemoglobin: 11.5 g/dL (ref 11.1–15.9)
Immature Grans (Abs): 0 10*3/uL (ref 0.0–0.1)
Immature Granulocytes: 0 %
Lymphocytes Absolute: 1.3 10*3/uL (ref 0.7–3.1)
Lymphs: 20 %
MCH: 25.6 pg — ABNORMAL LOW (ref 26.6–33.0)
MCHC: 31.2 g/dL — ABNORMAL LOW (ref 31.5–35.7)
MCV: 82 fL (ref 79–97)
Monocytes Absolute: 0.4 10*3/uL (ref 0.1–0.9)
Monocytes: 6 %
Neutrophils Absolute: 4.4 10*3/uL (ref 1.4–7.0)
Neutrophils: 69 %
Platelets: 261 10*3/uL (ref 150–450)
RBC: 4.5 x10E6/uL (ref 3.77–5.28)
RDW: 15.5 % — ABNORMAL HIGH (ref 11.7–15.4)
WBC: 6.3 10*3/uL (ref 3.4–10.8)

## 2020-06-19 LAB — VITAMIN D 25 HYDROXY (VIT D DEFICIENCY, FRACTURES): Vit D, 25-Hydroxy: 12.8 ng/mL — ABNORMAL LOW (ref 30.0–100.0)

## 2020-06-19 LAB — COMPREHENSIVE METABOLIC PANEL
ALT: 19 IU/L (ref 0–32)
AST: 18 IU/L (ref 0–40)
Albumin/Globulin Ratio: 1.9 (ref 1.2–2.2)
Albumin: 4.2 g/dL (ref 3.8–4.9)
Alkaline Phosphatase: 116 IU/L (ref 48–121)
BUN/Creatinine Ratio: 11 (ref 9–23)
BUN: 9 mg/dL (ref 6–24)
Bilirubin Total: 0.4 mg/dL (ref 0.0–1.2)
CO2: 23 mmol/L (ref 20–29)
Calcium: 9.1 mg/dL (ref 8.7–10.2)
Chloride: 104 mmol/L (ref 96–106)
Creatinine, Ser: 0.8 mg/dL (ref 0.57–1.00)
GFR calc Af Amer: 95 mL/min/{1.73_m2} (ref >59)
GFR calc non Af Amer: 83 mL/min/{1.73_m2} (ref >59)
Globulin, Total: 2.2 g/dL (ref 1.5–4.5)
Glucose: 97 mg/dL (ref 65–99)
Potassium: 4.6 mmol/L (ref 3.5–5.2)
Sodium: 142 mmol/L (ref 134–144)
Total Protein: 6.4 g/dL (ref 6.0–8.5)

## 2020-06-19 LAB — TSH: TSH: 2.7 u[IU]/mL (ref 0.450–4.500)

## 2020-06-19 LAB — FOLATE: Folate: 4.6 ng/mL (ref >3.0)

## 2020-06-19 LAB — INSULIN, RANDOM: INSULIN: 15.7 u[IU]/mL (ref 2.6–24.9)

## 2020-06-19 LAB — HEMOGLOBIN A1C
Est. average glucose Bld gHb Est-mCnc: 123 mg/dL
Hgb A1c MFr Bld: 5.9 % — ABNORMAL HIGH (ref 4.8–5.6)

## 2020-06-19 LAB — T3: T3, Total: 133 ng/dL (ref 71–180)

## 2020-06-19 LAB — LIPID PANEL WITH LDL/HDL RATIO
Cholesterol, Total: 214 mg/dL — ABNORMAL HIGH (ref 100–199)
HDL: 37 mg/dL — ABNORMAL LOW (ref >39)
LDL Chol Calc (NIH): 154 mg/dL — ABNORMAL HIGH (ref 0–99)
LDL/HDL Ratio: 4.2 ratio — ABNORMAL HIGH (ref 0.0–3.2)
Triglycerides: 126 mg/dL (ref 0–149)
VLDL Cholesterol Cal: 23 mg/dL (ref 5–40)

## 2020-06-19 LAB — T4, FREE: Free T4: 1.12 ng/dL (ref 0.82–1.77)

## 2020-06-19 LAB — VITAMIN B12: Vitamin B-12: 241 pg/mL (ref 232–1245)

## 2020-06-23 ENCOUNTER — Telehealth (INDEPENDENT_AMBULATORY_CARE_PROVIDER_SITE_OTHER): Payer: 59 | Admitting: Psychology

## 2020-06-23 ENCOUNTER — Other Ambulatory Visit: Payer: Self-pay

## 2020-06-23 DIAGNOSIS — F5089 Other specified eating disorder: Secondary | ICD-10-CM

## 2020-06-25 NOTE — Progress Notes (Signed)
Chief Complaint:   OBESITY Holly Krueger (MR# [id number]) is a 54 y.o. female who presents for evaluation and treatment of obesity and related comorbidities. Current BMI is Body mass index is 43.22 kg/m. Holly Krueger has been struggling with her weight for many years and has been unsuccessful in either losing weight, maintaining weight loss, or reaching her healthy weight goal.  Holly Krueger is currently in the action stage of change and ready to dedicate time achieving and maintaining a healthier weight. Holly Krueger is interested in becoming our patient and working on intensive lifestyle modifications including (but not limited to) diet and exercise for weight loss.  Holly Krueger's habits were reviewed today and are as follows: Her family eats meals together, she thinks her family will eat healthier with her, her desired weight loss is 94 lbs, she started gaining weight near lately, her heaviest weight ever was 246 pounds, she is a picky eater and doesn't like to eat healthier foods, she has significant food cravings issues, she snacks frequently in the evenings, she wakes up frequently in the middle of the night to eat, she skips meals frequently, she is frequently drinking liquids with calories and she struggles with emotional eating.  Depression Screen [place]'s Food and Mood (modified PHQ-9) score was 3.  Depression screen [facility] 2/9 06/18/2020  Decreased Interest 0  Down, Depressed, Hopeless 0  PHQ - 2 Score 0  Altered sleeping 1  Tired, decreased energy 1  Change in appetite 1  Feeling bad or failure about yourself  0  Trouble concentrating 0  Moving slowly or fidgety/restless 0  Suicidal thoughts 0  PHQ-9 Score 3  Difficult doing work/chores Not difficult at all   Subjective:   1. Other fatigue Holly Krueger admits to daytime somnolence and admits to waking up still tired. Patent has a history of symptoms of daytime fatigue. Holly Krueger generally gets 5 or 6 hours of sleep per night, and states that she has  nightime awakenings. Snoring is present. Apneic episodes are present. Epworth Sleepiness Score is 5.  2. Shortness of breath on exertion Holly Krueger notes increasing shortness of breath with exercising and seems to be worsening over time with weight gain. She notes getting out of breath sooner with activity than she used to. This has not gotten worse recently. Holly Krueger denies shortness of breath at rest or orthopnea.  3. Essential hypertension Holly Krueger's blood pressure is elevated today. She is on metoprolol, and she is working on trying to improve with weight loss.  4. Atrial fibrillation, unspecified type ([place]) Holly Krueger was diagnosed with aFib approximately 1 year ago. Her symptoms are controlled on metoprolol.  Assessment/Plan:   1. Other fatigue Holly Krueger does feel that her weight is causing her energy to be lower than it should be. Fatigue may be related to obesity, depression or many other causes. Labs will be ordered, and in the meanwhile, [name] will focus on self care including making healthy food choices, increasing physical activity and focusing on stress reduction.  - EKG 12-Lead - CBC with Differential/Platelet - Vitamin B12 - Lipid Panel With LDL/HDL Ratio - Insulin, random - Hemoglobin A1c - Folate - T3 - T4, free - TSH - VITAMIN D 25 Hydroxy (Vit-D Deficiency, Fractures)  2. Shortness of breath on exertion Holly Krueger does feel that she gets out of breath more easily that she used to when she exercises. Holly Krueger's shortness of breath appears to be obesity related and exercise induced. She has agreed to work on weight loss and gradually increase  exercise to treat her exercise induced shortness of breath. Will continue to monitor closely.  3. Essential hypertension Holly Krueger will continue her medications and will start her eating plan. She will continue working on healthy weight loss and exercise to improve blood pressure control. We will watch for signs of hypotension as she continues her  lifestyle modifications. We will check labs today.  - Comprehensive metabolic panel  4. Atrial fibrillation, unspecified type ([place]) Holly Krueger's EKG does not show aFib, she will continue to work on diet and weight loss to help decrease the risk of aFib exacerbation.  5. Depression screening Holly Krueger had a negative depression screening. Depression is commonly associated with obesity and often results in emotional eating behaviors. We will monitor this closely and work on CBT to help improve the non-hunger eating patterns. Referral to Psychology may be required if no improvement is seen as she continues in our clinic.  6. Class 3 severe obesity with serious comorbidity and body mass index (BMI) of 40.0 to 44.9 in adult, unspecified obesity type (HCC) Holly Krueger is currently in the action stage of change and her goal is to continue with weight loss efforts. I recommend Holly Krueger begin the structured treatment plan as follows:  She has agreed to the Category 2 Plan.  Exercise goals: No exercise has been prescribed for now, while we concentrate on nutritional changes.  Behavioral modification strategies: increasing lean protein intake.  She was informed of the importance of frequent follow-up visits to maximize her success with intensive lifestyle modifications for her multiple health conditions. She was informed we would discuss her lab results at her next visit unless there is a critical issue that needs to be addressed sooner. Holly Krueger agreed to keep her next visit at the agreed upon time to discuss these results.  Objective:   Blood pressure (!) 142/64, pulse 74, temperature 97.8 F (36.6 C), height 5\' 3"  (1.6 m), weight 244 lb (110.7 kg), SpO2 99 %. Body mass index is 43.22 kg/m.  EKG: Normal sinus rhythm, rate 65 BPM.  Indirect Calorimeter completed today shows a VO2 of 221 and a REE of 1541.  Her calculated basal metabolic rate is 9563 thus her basal metabolic rate is worse than expected.  General:  Cooperative, alert, well developed, in no acute distress. HEENT: Conjunctivae and lids unremarkable. Cardiovascular: Regular rhythm.  Lungs: Normal work of breathing. Neurologic: No focal deficits.   Lab Results  Component Value Date   CREATININE 0.80 06/18/2020   BUN 9 06/18/2020   NA 142 06/18/2020   K 4.6 06/18/2020   CL 104 06/18/2020   CO2 23 06/18/2020   Lab Results  Component Value Date   ALT 19 06/18/2020   AST 18 06/18/2020   ALKPHOS 116 06/18/2020   BILITOT 0.4 06/18/2020   Lab Results  Component Value Date   HGBA1C 5.9 (H) 06/18/2020   Lab Results  Component Value Date   INSULIN 15.7 06/18/2020   Lab Results  Component Value Date   TSH 2.700 06/18/2020   Lab Results  Component Value Date   CHOL 214 (H) 06/18/2020   HDL 37 (L) 06/18/2020   LDLCALC 154 (H) 06/18/2020   TRIG 126 06/18/2020   Lab Results  Component Value Date   WBC 6.3 06/18/2020   HGB 11.5 06/18/2020   HCT 36.9 06/18/2020   MCV 82 06/18/2020   PLT 261 06/18/2020   No results found for: IRON, TIBC, FERRITIN  Attestation Statements:   Reviewed by clinician on day of visit:  allergies, medications, problem list, medical history, surgical history, family history, social history, and previous encounter notes.   I, Trixie Dredge, am acting as transcriptionist for Dennard Nip, MD.  I have reviewed the above documentation for accuracy and completeness, and I agree with the above. - Dennard Nip, MD

## 2020-06-29 DIAGNOSIS — I4891 Unspecified atrial fibrillation: Secondary | ICD-10-CM | POA: Insufficient documentation

## 2020-06-29 DIAGNOSIS — I10 Essential (primary) hypertension: Secondary | ICD-10-CM | POA: Insufficient documentation

## 2020-06-29 DIAGNOSIS — R5383 Other fatigue: Secondary | ICD-10-CM | POA: Insufficient documentation

## 2020-06-29 DIAGNOSIS — R0602 Shortness of breath: Secondary | ICD-10-CM | POA: Insufficient documentation

## 2020-07-02 ENCOUNTER — Ambulatory Visit (INDEPENDENT_AMBULATORY_CARE_PROVIDER_SITE_OTHER): Payer: 59 | Admitting: Family Medicine

## 2020-07-02 ENCOUNTER — Other Ambulatory Visit: Payer: Self-pay

## 2020-07-02 ENCOUNTER — Encounter (INDEPENDENT_AMBULATORY_CARE_PROVIDER_SITE_OTHER): Payer: Self-pay | Admitting: Family Medicine

## 2020-07-02 VITALS — BP 143/81 | HR 66 | Temp 97.5°F | Ht 63.0 in | Wt 240.0 lb

## 2020-07-02 DIAGNOSIS — I10 Essential (primary) hypertension: Secondary | ICD-10-CM

## 2020-07-02 DIAGNOSIS — Z6841 Body Mass Index (BMI) 40.0 and over, adult: Secondary | ICD-10-CM

## 2020-07-02 DIAGNOSIS — E538 Deficiency of other specified B group vitamins: Secondary | ICD-10-CM | POA: Diagnosis not present

## 2020-07-02 DIAGNOSIS — Z9189 Other specified personal risk factors, not elsewhere classified: Secondary | ICD-10-CM

## 2020-07-02 DIAGNOSIS — E559 Vitamin D deficiency, unspecified: Secondary | ICD-10-CM

## 2020-07-02 DIAGNOSIS — E782 Mixed hyperlipidemia: Secondary | ICD-10-CM | POA: Diagnosis not present

## 2020-07-02 DIAGNOSIS — R7303 Prediabetes: Secondary | ICD-10-CM

## 2020-07-02 MED ORDER — VITAMIN D (ERGOCALCIFEROL) 1.25 MG (50000 UNIT) PO CAPS: 50000.0000 [IU] | ORAL_CAPSULE | ORAL | 0 refills | Status: DC

## 2020-07-05 NOTE — Progress Notes (Signed)
Chief Complaint:   OBESITY Holly Krueger is here to discuss her progress with her obesity treatment plan along with follow-up of her obesity related diagnoses. Holly Krueger is on the Category 2 Plan and states she is following her eating plan approximately 85% of the time. Holly Krueger states she is doing 0 minutes 0 times per week.  Today's visit was #: 2 Starting weight: 244 lbs Starting date: 06/18/2020 Today's weight: 240 lbs Today's date: 07/02/2020 Total lbs lost to date: 4 Total lbs lost since last in-office visit: 4  Interim History: Holly Krueger has done well with weight loss on her Category 2 plan. Her hunger was controlled and she is working on meal planning and prepping. She made some small substitutions but she has still done well.  Subjective:   1. Mixed hyperlipidemia Holly Krueger's LDL is elevated and HDL is low. She is working on diet and weight loss. She denies chest pain. I discussed labs with the patient today.  2. Vitamin D deficiency Holly Krueger is not on Vit D, and her level is very low. She notes fatigue. I discussed labs with the patient today.  3. B12 deficiency Holly Krueger's B12 is on the low end of normal, but her hemoglobin and MCV are within normal limits. She is now on a B12 rich eating plan. I discussed labs with the patient today.  4. Pre-diabetes Holly Krueger's A1c and insulin are elevated, and she notes some polyphagia but this improved with diet. I discussed labs with the patient today.  5. Essential hypertension Holly Krueger's blood pressure is elevated again today. She states her blood pressure mostly runs in 130's/80's at home. She is on Toprol XL. She has had hypokalemia with hydrochlorothiazide and cough with ACE/ARB in the past.  6. At risk for diabetes mellitus Holly Krueger is at higher than average risk for developing diabetes due to her obesity.   Assessment/Plan:   1. Mixed hyperlipidemia Cardiovascular risk and specific lipid/LDL goals reviewed. We discussed several lifestyle  modifications today. Carman will continue to work on diet, exercise and weight loss efforts. We will recheck labs in 3 months. Orders and follow up as documented in patient record.   2. Vitamin D deficiency Low Vitamin D level contributes to fatigue and are associated with obesity, breast, and colon cancer. Holly Krueger agreed to start prescription Vitamin D 50,000 IU every week with no refills. We will recheck labs in 3 months. She will follow-up for routine testing of Vitamin D, at least 2-3 times per year to avoid over-replacement.  - Vitamin D, Ergocalciferol, (DRISDOL) 1.25 MG (50000 UNIT) CAPS capsule; Take 1 capsule (50,000 Units total) by mouth every 7 (seven) days.  Dispense: 4 capsule; Refill: 0  3. B12 deficiency The diagnosis was reviewed with the patient. We will continue to monitor. Holly Krueger will continue her meal plan, and we will recheck labs in 3 months.Orders and follow up as documented in patient record.  4. Pre-diabetes [name] will continue to work on weight loss, diet, exercise, and decreasing simple carbohydrates to help decrease the risk of diabetes. We will recheck labs in 3 months.  5. Essential hypertension Jonnie will continue working on healthy weight loss, diet, and exercise to improve blood pressure control. We will watch for signs of hypotension as she continues her lifestyle modifications.  6. At risk for diabetes mellitus Holly Krueger was given approximately 30 minutes of diabetes education and counseling today. We discussed intensive lifestyle modifications today with an emphasis on weight loss as well as increasing exercise and decreasing simple  carbohydrates in her diet. We also reviewed medication options with an emphasis on risk versus benefit of those discussed.   Repetitive spaced learning was employed today to elicit superior memory formation and behavioral change.  7. Class 3 severe obesity with serious comorbidity and body mass index (BMI) of 40.0 to 44.9 in adult,  unspecified obesity type (HCC) Holly Krueger is currently in the action stage of change. As such, her goal is to continue with weight loss efforts. She has agreed to the Category 2 Plan with lean meat equivalents.   Behavioral modification strategies: increasing lean protein intake.  Holly Krueger has agreed to follow-up with our clinic in 2 weeks with [facility], FNP-C. She was informed of the importance of frequent follow-up visits to maximize her success with intensive lifestyle modifications for her multiple health conditions.   Objective:   Blood pressure (!) 143/81, pulse 66, temperature (!) 97.5 F (36.4 C), height 5\' 3"  (1.6 m), weight 240 lb (108.9 kg), SpO2 97 %. Body mass index is 42.51 kg/m.  General: Cooperative, alert, well developed, in no acute distress. HEENT: Conjunctivae and lids unremarkable. Cardiovascular: Regular rhythm.  Lungs: Normal work of breathing. Neurologic: No focal deficits.   Lab Results  Component Value Date   CREATININE 0.80 06/18/2020   BUN 9 06/18/2020   NA 142 06/18/2020   K 4.6 06/18/2020   CL 104 06/18/2020   CO2 23 06/18/2020   Lab Results  Component Value Date   ALT 19 06/18/2020   AST 18 06/18/2020   ALKPHOS 116 06/18/2020   BILITOT 0.4 06/18/2020   Lab Results  Component Value Date   HGBA1C 5.9 (H) 06/18/2020   Lab Results  Component Value Date   INSULIN 15.7 06/18/2020   Lab Results  Component Value Date   TSH 2.700 06/18/2020   Lab Results  Component Value Date   CHOL 214 (H) 06/18/2020   HDL 37 (L) 06/18/2020   LDLCALC 154 (H) 06/18/2020   TRIG 126 06/18/2020   Lab Results  Component Value Date   WBC 6.3 06/18/2020   HGB 11.5 06/18/2020   HCT 36.9 06/18/2020   MCV 82 06/18/2020   PLT 261 06/18/2020   No results found for: IRON, TIBC, FERRITIN  Attestation Statements:   Reviewed by clinician on day of visit: allergies, medications, problem list, medical history, surgical history, family history, social history, and  previous encounter notes.   I, Trixie Dredge, am acting as transcriptionist for Dennard Nip, MD.  I have reviewed the above documentation for accuracy and completeness, and I agree with the above. -  Dennard Nip, MD

## 2020-07-15 ENCOUNTER — Encounter (INDEPENDENT_AMBULATORY_CARE_PROVIDER_SITE_OTHER): Payer: Self-pay

## 2020-07-15 ENCOUNTER — Ambulatory Visit (INDEPENDENT_AMBULATORY_CARE_PROVIDER_SITE_OTHER): Payer: 59 | Admitting: Family Medicine

## 2020-07-27 ENCOUNTER — Other Ambulatory Visit: Payer: Self-pay | Admitting: Student

## 2020-07-29 ENCOUNTER — Ambulatory Visit (INDEPENDENT_AMBULATORY_CARE_PROVIDER_SITE_OTHER): Payer: 59 | Admitting: Family Medicine

## 2020-07-29 ENCOUNTER — Encounter (INDEPENDENT_AMBULATORY_CARE_PROVIDER_SITE_OTHER): Payer: Self-pay | Admitting: Family Medicine

## 2020-07-29 ENCOUNTER — Other Ambulatory Visit: Payer: Self-pay

## 2020-07-29 VITALS — BP 114/72 | HR 61 | Temp 98.3°F | Ht 63.0 in | Wt 241.0 lb

## 2020-07-29 DIAGNOSIS — Z6841 Body Mass Index (BMI) 40.0 and over, adult: Secondary | ICD-10-CM | POA: Diagnosis not present

## 2020-07-29 DIAGNOSIS — F3289 Other specified depressive episodes: Secondary | ICD-10-CM | POA: Diagnosis not present

## 2020-07-30 ENCOUNTER — Encounter (INDEPENDENT_AMBULATORY_CARE_PROVIDER_SITE_OTHER): Payer: Self-pay | Admitting: Family Medicine

## 2020-08-04 ENCOUNTER — Encounter (INDEPENDENT_AMBULATORY_CARE_PROVIDER_SITE_OTHER): Payer: Self-pay | Admitting: Family Medicine

## 2020-08-04 MED ORDER — TOPIRAMATE 25 MG PO TABS: 25.0000 mg | ORAL_TABLET | Freq: Every day | ORAL | 0 refills | Status: DC

## 2020-08-10 NOTE — Progress Notes (Signed)
Chief Complaint:   OBESITY Holly Krueger is here to discuss her progress with her obesity treatment plan along with follow-up of her obesity related diagnoses. Holly Krueger is on the Category 2 Plan and states she is following her eating plan approximately 80% of the time. Holly Krueger states she is doing 0 minutes 0 times per week.  Today's visit was #: 3 Starting weight: 244 lbs Starting date: 06/18/2020 Today's weight: 241 lbs Today's date: 07/29/2020 Total lbs lost to date: 3 Total lbs lost since last in-office visit: 0  Interim History: Holly Krueger is struggling with weight loss and is eating everything on her plan. She also still drinking Pepsi but less than previously. She is open to working on emotional eating strategies.  Subjective:   1. Other depression with emotional eating Holly Krueger notes some cravings and stress eating. She is having a difficult time avoiding Pepsi and is frustrated with herself.  Assessment/Plan:   1. Other depression with emotional eating Behavior modification techniques were discussed today to help Holly Krueger deal with her emotional/non-hunger eating behaviors. Holly Krueger agreed to start topiramate 25 mg qhs with no refills, and will continue to monitor closely. Orders and follow up as documented in patient record.   - topiramate (TOPAMAX) 25 MG tablet; Take 1 tablet (25 mg total) by mouth at bedtime.  Dispense: 30 tablet; Refill: 0  2. Class 3 severe obesity with serious comorbidity and body mass index (BMI) of 40.0 to 44.9 in adult, unspecified obesity type (HCC) Holly Krueger is currently in the action stage of change. As such, her goal is to continue with weight loss efforts. She has agreed to the Category 2 Plan.   Behavioral modification strategies: emotional eating strategies.  Holly Krueger has agreed to follow-up with our clinic in 2 to 3 weeks. She was informed of the importance of frequent follow-up visits to maximize her success with intensive lifestyle modifications for her  multiple health conditions.   Objective:   Blood pressure 114/72, pulse 61, temperature 98.3 F (36.8 C), height 5\' 3"  (1.6 m), weight 241 lb (109.3 kg), SpO2 96 %. Body mass index is 42.69 kg/m.  General: Cooperative, alert, well developed, in no acute distress. HEENT: Conjunctivae and lids unremarkable. Cardiovascular: Regular rhythm.  Lungs: Normal work of breathing. Neurologic: No focal deficits.   Lab Results  Component Value Date   CREATININE 0.80 06/18/2020   BUN 9 06/18/2020   NA 142 06/18/2020   K 4.6 06/18/2020   CL 104 06/18/2020   CO2 23 06/18/2020   Lab Results  Component Value Date   ALT 19 06/18/2020   AST 18 06/18/2020   ALKPHOS 116 06/18/2020   BILITOT 0.4 06/18/2020   Lab Results  Component Value Date   HGBA1C 5.9 (H) 06/18/2020   Lab Results  Component Value Date   INSULIN 15.7 06/18/2020   Lab Results  Component Value Date   TSH 2.700 06/18/2020   Lab Results  Component Value Date   CHOL 214 (H) 06/18/2020   HDL 37 (L) 06/18/2020   LDLCALC 154 (H) 06/18/2020   TRIG 126 06/18/2020   Lab Results  Component Value Date   WBC 6.3 06/18/2020   HGB 11.5 06/18/2020   HCT 36.9 06/18/2020   MCV 82 06/18/2020   PLT 261 06/18/2020   No results found for: IRON, TIBC, FERRITIN  Attestation Statements:   Reviewed by clinician on day of visit: allergies, medications, problem list, medical history, surgical history, family history, social history, and previous encounter notes.  Time spent on visit including pre-visit chart review and post-visit care and charting was 30 minutes.    I, Holly Krueger, am acting as transcriptionist for Dennard Nip, MD.  I have reviewed the above documentation for accuracy and completeness, and I agree with the above. -  Dennard Nip, MD

## 2020-08-17 ENCOUNTER — Encounter (INDEPENDENT_AMBULATORY_CARE_PROVIDER_SITE_OTHER): Payer: Self-pay | Admitting: Family Medicine

## 2020-08-17 ENCOUNTER — Other Ambulatory Visit: Payer: Self-pay

## 2020-08-17 ENCOUNTER — Ambulatory Visit (INDEPENDENT_AMBULATORY_CARE_PROVIDER_SITE_OTHER): Payer: 59 | Admitting: Family Medicine

## 2020-08-17 VITALS — BP 122/77 | HR 69 | Temp 97.9°F | Ht 63.0 in | Wt 233.0 lb

## 2020-08-17 DIAGNOSIS — F3289 Other specified depressive episodes: Secondary | ICD-10-CM

## 2020-08-17 DIAGNOSIS — Z9189 Other specified personal risk factors, not elsewhere classified: Secondary | ICD-10-CM

## 2020-08-17 DIAGNOSIS — G4709 Other insomnia: Secondary | ICD-10-CM

## 2020-08-17 DIAGNOSIS — Z6841 Body Mass Index (BMI) 40.0 and over, adult: Secondary | ICD-10-CM

## 2020-08-17 MED ORDER — TOPIRAMATE 25 MG PO TABS: 25.0000 mg | ORAL_TABLET | Freq: Every day | ORAL | 0 refills | Status: DC

## 2020-08-18 NOTE — Progress Notes (Signed)
Chief Complaint:   OBESITY Holly Krueger is here to discuss her progress with her obesity treatment plan along with follow-up of her obesity related diagnoses. Holly Krueger is on the Category 2 Plan and states she is following her eating plan approximately 90% of the time. [name] states she is doing 0 minutes 0 times per week.  Today's visit was #: 4 Starting weight: 244 lbs Starting date: 06/18/2020 Today's weight: 233 lbs Today's date: 08/17/2020 Total lbs lost to date: 11 Total lbs lost since last in-office visit: 8  Interim History: [name] continues to do well with weight loss on her eating plan. She is not doing any formal exercise but she is walking 7,000-10,000 steps daily at work. Her hunger is controlled and she is doing well with meal prepping and avoiding temptations.  Subjective:   1. Other insomnia [name] is stable on Ambien and Topamax. She is sleeping approximately 5-6 hours per night, but trying to sleep more. She is waking up frequently to use the bathroom.  2. Other depression with emotional eating Holly Krueger is doing well controlling her emotional eating. She notes dysgeusia but no paresthesias.   3. At risk for impaired metabolic function Holly Krueger is at increased risk for impaired metabolic function due to insomnia.  Assessment/Plan:   1. Other insomnia The problem of recurrent insomnia was discussed. Orders and follow up as documented in patient record. Counseling: Intensive lifestyle modifications are the first line treatment for this issue. We discussed several lifestyle modifications today. Holly Krueger is to continue to work on sleep quantity with a goal of 7-8 hours per night total. She will continue to work on diet, exercise and weight loss efforts.   2. Other depression with emotional eating Behavior modification techniques were discussed today to help Holly Krueger deal with her emotional/non-hunger eating behaviors. We will refill Topamax for 1 month. Orders and follow up as  documented in patient record.   - topiramate (TOPAMAX) 25 MG tablet; Take 1 tablet (25 mg total) by mouth at bedtime.  Dispense: 30 tablet; Refill: 0  3. At risk for impaired metabolic function Holly Krueger was given approximately 15 minutes of impaired  metabolic function prevention counseling today. We discussed intensive lifestyle modifications today with an emphasis on specific nutrition and exercise instructions and strategies.   Repetitive spaced learning was employed today to elicit superior memory formation and behavioral change.  4. Class 3 severe obesity with serious comorbidity and body mass index (BMI) of 40.0 to 44.9 in adult, unspecified obesity type (HCC) Holly Krueger is currently in the action stage of change. As such, her goal is to continue with weight loss efforts. She has agreed to the Category 2 Plan.   Exercise goals: As is, walking at work.  Behavioral modification strategies: increasing water intake and dealing with family or coworker sabotage.  Holly Krueger has agreed to follow-up with our clinic in 2 to 3 weeks. She was informed of the importance of frequent follow-up visits to maximize her success with intensive lifestyle modifications for her multiple health conditions.   Objective:   Blood pressure 122/77, pulse 69, temperature 97.9 F (36.6 C), height 5\' 3"  (1.6 m), weight 233 lb (105.7 kg), SpO2 98 %. Body mass index is 41.27 kg/m.  General: Cooperative, alert, well developed, in no acute distress. HEENT: Conjunctivae and lids unremarkable. Cardiovascular: Regular rhythm.  Lungs: Normal work of breathing. Neurologic: No focal deficits.   Lab Results  Component Value Date   CREATININE 0.80 06/18/2020   BUN 9 06/18/2020  NA 142 06/18/2020   K 4.6 06/18/2020   CL 104 06/18/2020   CO2 23 06/18/2020   Lab Results  Component Value Date   ALT 19 06/18/2020   AST 18 06/18/2020   ALKPHOS 116 06/18/2020   BILITOT 0.4 06/18/2020   Lab Results  Component Value Date     HGBA1C 5.9 (H) 06/18/2020   Lab Results  Component Value Date   INSULIN 15.7 06/18/2020   Lab Results  Component Value Date   TSH 2.700 06/18/2020   Lab Results  Component Value Date   CHOL 214 (H) 06/18/2020   HDL 37 (L) 06/18/2020   LDLCALC 154 (H) 06/18/2020   TRIG 126 06/18/2020   Lab Results  Component Value Date   WBC 6.3 06/18/2020   HGB 11.5 06/18/2020   HCT 36.9 06/18/2020   MCV 82 06/18/2020   PLT 261 06/18/2020   No results found for: IRON, TIBC, FERRITIN  Attestation Statements:   Reviewed by clinician on day of visit: allergies, medications, problem list, medical history, surgical history, family history, social history, and previous encounter notes.   I, Trixie Dredge, am acting as transcriptionist for Dennard Nip, MD.  I have reviewed the above documentation for accuracy and completeness, and I agree with the above. -  Dennard Nip, MD

## 2020-09-01 ENCOUNTER — Encounter (INDEPENDENT_AMBULATORY_CARE_PROVIDER_SITE_OTHER): Payer: Self-pay | Admitting: Family Medicine

## 2020-09-01 ENCOUNTER — Other Ambulatory Visit: Payer: Self-pay

## 2020-09-01 ENCOUNTER — Ambulatory Visit (INDEPENDENT_AMBULATORY_CARE_PROVIDER_SITE_OTHER): Payer: 59 | Admitting: Family Medicine

## 2020-09-01 VITALS — BP 129/78 | HR 62 | Temp 97.6°F | Ht 63.0 in | Wt 234.0 lb

## 2020-09-01 DIAGNOSIS — Z9189 Other specified personal risk factors, not elsewhere classified: Secondary | ICD-10-CM | POA: Diagnosis not present

## 2020-09-01 DIAGNOSIS — Z6841 Body Mass Index (BMI) 40.0 and over, adult: Secondary | ICD-10-CM

## 2020-09-01 DIAGNOSIS — E559 Vitamin D deficiency, unspecified: Secondary | ICD-10-CM | POA: Diagnosis not present

## 2020-09-01 DIAGNOSIS — G4709 Other insomnia: Secondary | ICD-10-CM

## 2020-09-01 MED ORDER — VITAMIN D (ERGOCALCIFEROL) 1.25 MG (50000 UNIT) PO CAPS: 50000.0000 [IU] | ORAL_CAPSULE | ORAL | 0 refills | Status: DC

## 2020-09-02 NOTE — Progress Notes (Signed)
Chief Complaint:   OBESITY Holly Krueger is here to discuss her progress with her obesity treatment plan along with follow-up of her obesity related diagnoses. Holly Krueger is on the Category 2 Plan and states she is following her eating plan approximately 90% of the time. Holly Krueger states she is doing 0 minutes 0 times per week.  Today's visit was #: 5 Starting weight: 244 lbs Starting date: 06/18/2020 Today's weight: 234 lbs Today's date: 09/01/2020 Total lbs lost to date: 10 Total lbs lost since last in-office visit: 0  Interim History: Holly Krueger continues to work on diet and weight loss. She is retaining a couple of lbs of water weight, and she has done better than she thinks. She is ready to discuss Thanksgiving eating strategies.  Subjective:   1. Vitamin D deficiency Holly Krueger is on Vit D prescription, and last vit D level was not yet at goal.  2. Other insomnia Holly Krueger takes Ambien as needed. She struggles to both fall asleep and stay asleep. She has a history of obstructive sleep apnea, but she cannot tolerate CPAP.  3. At risk for heart disease Holly Krueger is at a higher than average risk for cardiovascular disease due to obesity.   Assessment/Plan:   1. Vitamin D deficiency Low Vitamin D level contributes to fatigue and are associated with obesity, breast, and colon cancer. We will refill prescription Vitamin D for 1 month, and we will recheck labs in 1 month. Holly Krueger will follow-up for routine testing of Vitamin D, at least 2-3 times per year to avoid over-replacement.  - Vitamin D, Ergocalciferol, (DRISDOL) 1.25 MG (50000 UNIT) CAPS capsule; Take 1 capsule (50,000 Units total) by mouth every 7 (seven) days.  Dispense: 4 capsule; Refill: 0  2. Other insomnia The problem of recurrent insomnia was discussed. Orders and follow up as documented in patient record. Counseling: Intensive lifestyle modifications are the first line treatment for this issue. We discussed several lifestyle modifications  today. Holly Krueger was encouraged to increase her sleep to >6 hours per night, and reconsider discussing this further with her sleep doctor to look at other masks, treatment options, etc. She will continue to work on diet, exercise and weight loss efforts.   3. At risk for heart disease Holly Krueger was given approximately 15 minutes of coronary artery disease prevention counseling today. She is 54 y.o. female and has risk factors for heart disease including obesity. We discussed intensive lifestyle modifications today with an emphasis on specific weight loss instructions and strategies.   Repetitive spaced learning was employed today to elicit superior memory formation and behavioral change.  4. Class 3 severe obesity with serious comorbidity and body mass index (BMI) of 40.0 to 44.9 in adult, unspecified obesity type (HCC) Holly Krueger is currently in the action stage of change. As such, her goal is to continue with weight loss efforts. She has agreed to the Category 2 Plan.   Behavioral modification strategies: holiday eating strategies .  Holly Krueger has agreed to follow-up with our clinic in 2 to 3 weeks. She was informed of the importance of frequent follow-up visits to maximize her success with intensive lifestyle modifications for her multiple health conditions.   Objective:   Blood pressure 129/78, pulse 62, temperature 97.6 F (36.4 C), height 5\' 3"  (1.6 m), weight 234 lb (106.1 kg), SpO2 98 %. Body mass index is 41.45 kg/m.  General: Cooperative, alert, well developed, in no acute distress. HEENT: Conjunctivae and lids unremarkable. Cardiovascular: Regular rhythm.  Lungs: Normal work of breathing.  Neurologic: No focal deficits.   Lab Results  Component Value Date   CREATININE 0.80 06/18/2020   BUN 9 06/18/2020   NA 142 06/18/2020   K 4.6 06/18/2020   CL 104 06/18/2020   CO2 23 06/18/2020   Lab Results  Component Value Date   ALT 19 06/18/2020   AST 18 06/18/2020   ALKPHOS 116 06/18/2020     BILITOT 0.4 06/18/2020   Lab Results  Component Value Date   HGBA1C 5.9 (H) 06/18/2020   Lab Results  Component Value Date   INSULIN 15.7 06/18/2020   Lab Results  Component Value Date   TSH 2.700 06/18/2020   Lab Results  Component Value Date   CHOL 214 (H) 06/18/2020   HDL 37 (L) 06/18/2020   LDLCALC 154 (H) 06/18/2020   TRIG 126 06/18/2020   Lab Results  Component Value Date   WBC 6.3 06/18/2020   HGB 11.5 06/18/2020   HCT 36.9 06/18/2020   MCV 82 06/18/2020   PLT 261 06/18/2020   No results found for: IRON, TIBC, FERRITIN  Attestation Statements:   Reviewed by clinician on day of visit: allergies, medications, problem list, medical history, surgical history, family history, social history, and previous encounter notes.   I, Trixie Dredge, am acting as transcriptionist for Dennard Nip, MD.  I have reviewed the above documentation for accuracy and completeness, and I agree with the above. -  Dennard Nip, MD

## 2020-09-14 ENCOUNTER — Ambulatory Visit (INDEPENDENT_AMBULATORY_CARE_PROVIDER_SITE_OTHER): Payer: 59 | Admitting: Family Medicine

## 2020-09-14 ENCOUNTER — Other Ambulatory Visit: Payer: Self-pay

## 2020-09-14 ENCOUNTER — Encounter (INDEPENDENT_AMBULATORY_CARE_PROVIDER_SITE_OTHER): Payer: Self-pay | Admitting: Family Medicine

## 2020-09-14 VITALS — BP 153/77 | HR 60 | Temp 97.5°F | Ht 63.0 in | Wt 232.0 lb

## 2020-09-14 DIAGNOSIS — Z6841 Body Mass Index (BMI) 40.0 and over, adult: Secondary | ICD-10-CM

## 2020-09-14 DIAGNOSIS — I10 Essential (primary) hypertension: Secondary | ICD-10-CM | POA: Diagnosis not present

## 2020-09-17 NOTE — Patient Instructions (Signed)
Holly Krueger  09/17/2020     @PREFPERIOPPHARMACY @   Your procedure is scheduled on  09/23/2020  Report to [facility] at  [email or web address].  Call this number if you have problems the morning of surgery:  [phone]   Remember:  Do not eat or drink after midnight.                         Take these medicines the morning of surgery with A SIP OF WATER  Pepcid, metoprolol.    Do not wear jewelry, make-up or nail polish.  Do not wear lotions, powders, or perfumes. Please wear deodorant and brush your teeth.  Do not shave 48 hours prior to surgery.  Men may shave face and neck.  Do not bring valuables to the hospital.  [facility] is not responsible for any belongings or valuables.  Contacts, dentures or bridgework may not be worn into surgery.  Leave your suitcase in the car.  After surgery it may be brought to your room.  For patients admitted to the hospital, discharge time will be determined by your treatment team.  Patients discharged the day of surgery will not be allowed to drive home.   Name and phone number of your driver:   family Special instructions:  None  Please read over the following fact sheets that you were given. Anesthesia Post-op Instructions and Care and Recovery After Surgery       Ostectomy of the Foot, Care After This sheet gives you information about how to care for yourself after your procedure. Your health care provider may also give you more specific instructions. If you have problems or questions, contact your health care provider. What can I expect after the procedure? After the procedure, it is common to have:  Pain.  Swelling.  Stiffness. Follow these instructions at home: If you have a splint or supportive shoe:  Wear the splint or shoe as told by your health care provider. Remove it only as told by your health care provider.  Loosen the splint or shoe if your toes tingle, become numb, or turn cold and blue.  Keep  the splint or shoe clean.  If the splint or shoe is not waterproof: ? Do not let it get wet. ? Cover it with a watertight covering when you take a bath or a shower. If you have a cast:  Do not stick anything inside the cast to scratch your skin. Doing that increases your risk of infection.  Check the skin around the cast every day. Tell your health care provider about any concerns.  You may put lotion on dry skin around the edges of the cast. Do not put lotion on the skin underneath the cast.  Keep the cast clean.  If the cast is not waterproof: ? Do not let it get wet. ? Cover it with a watertight covering when you take a bath or a shower. Bathing  Do not take baths, swim, or use a hot tub until your health care provider approves. Ask your health care provider if you may take showers. You may only be allowed to take sponge baths for bathing.  If your cast or splint is not waterproof, cover it with a watertight covering when you take a bath or a shower.  Keep the bandage (dressing) dry until your health care provider says it can be removed. Incision  care   Follow instructions from your health care provider about how to take care of your incision. Make sure you: ? Wash your hands with soap and water before you change your bandage (dressing). If soap and water are not available, use hand sanitizer. ? Change your dressing as told by your health care provider. ? Leave stitches (sutures), skin glue, or adhesive strips in place. These skin closures may need to stay in place for 2 weeks or longer. If adhesive strip edges start to loosen and curl up, you may trim the loose edges. Do not remove adhesive strips completely unless your health care provider tells you to do that.  Check your incision area every day for signs of infection. Check for: ? Redness, swelling, or pain. ? Fluid or blood. ? Warmth. ? Pus or a bad smell. Managing pain, stiffness, and swelling   If directed, put ice  on the affected area. ? If you have a removable splint or shoe, remove it as told by your health care provider. ? Put ice in a plastic bag. ? Place a towel between your skin and the bag or between your cast and the bag. ? Leave the ice on for 20 minutes, 2-3 times a day.  Move your foot and toes often to avoid stiffness and to lessen swelling.  Raise (elevate) your foot area above the level of your heart while you are sitting or lying down. Driving  Do not drive or use heavy machinery while taking prescription pain medicine.  Ask your health care provider when it is safe to drive if you have a cast, splint, or supportive shoe on your foot. Activity  Return to your normal activities as told by your health care provider. Ask your health care provider what activities are safe for you.  Do exercises as told by your health care provider. Safety  Do not use your foot to support your body weight until your health care provider says that you can.  Use your crutches or walker as told by your health care provider. General instructions  Do not put pressure on any part of the cast or splint until it is fully hardened. This may take several hours.  Do not use any products that contain nicotine or tobacco, such as cigarettes and e-cigarettes. These can delay bone healing. If you need help quitting, ask your health care provider.  Take over-the-counter and prescription medicines only as told by your health care provider.  Keep all follow-up visits as told by your health care provider. This is important. Contact a health care provider if:  You have chills or a fever.  Your pain is not controlled by your pain medicine.  You have redness, swelling, or pain around your incision.  You have fluid or blood coming from your incision.  Your incision feels warm to the touch.  You have pus or a bad smell coming from your incision. Get help right away if:  You have severe pain.  You have new  pain, warmth, and swelling in your leg.  You have chest pain or difficulty breathing. Summary  After the procedure, it is common to have some pain, swelling, and stiffness.  Follow instructions from your health care provider about how to take care of your incision. Check the incision area every day for signs of infection.  Do not use your foot to support your body weight until your health care provider says that you can.  Move your foot and toes often to  avoid stiffness and to lessen swelling. This information is not intended to replace advice given to you by your health care provider. Make sure you discuss any questions you have with your health care provider. Document Revised: 11/29/2018 Document Reviewed: 11/22/2016 Elsevier Patient Education  [place], Adult An incision is a cut that a doctor makes in your skin for surgery (for a procedure). Most times, these cuts are closed after surgery. Your cut from surgery may be closed with stitches (sutures), staples, skin glue, or skin tape (adhesive strips). You may need to return to your doctor to have stitches or staples taken out. This may happen many days or many weeks after your surgery. The cut needs to be well cared for so it does not get infected. How to care for your cut Cut care   Follow instructions from your doctor about how to take care of your cut. Make sure you: ? Wash your hands with soap and water before you change your bandage (dressing). If you cannot use soap and water, use hand sanitizer. ? Change your bandage as told by your doctor. ? Leave stitches, skin glue, or skin tape in place. They may need to stay in place for 2 weeks or longer. If tape strips get loose and curl up, you may trim the loose edges. Do not remove tape strips completely unless your doctor says it is okay.  Check your cut area every day for signs of infection. Check for: ? More redness, swelling, or pain. ? More fluid or  blood. ? Warmth. ? Pus or a bad smell.  Ask your doctor how to clean the cut. This may include: ? Using mild soap and water. ? Using a clean towel to pat the cut dry after you clean it. ? Putting a cream or ointment on the cut. Do this only as told by your doctor. ? Covering the cut with a clean bandage.  Ask your doctor when you can leave the cut uncovered.  Do not take baths, swim, or use a hot tub until your doctor says it is okay. Ask your doctor if you can take showers. You may only be allowed to take sponge baths for bathing. Medicines  If you were prescribed an antibiotic medicine, cream, or ointment, take the antibiotic or put it on the cut as told by your doctor. Do not stop taking or putting on the antibiotic even if your condition gets better.  Take over-the-counter and prescription medicines only as told by your doctor. General instructions  Limit movement around your cut. This helps healing. ? Avoid straining, lifting, or exercise for the first month, or for as long as told by your doctor. ? Follow instructions from your doctor about going back to your normal activities. ? Ask your doctor what activities are safe.  Protect your cut from the sun when you are outside for the first 6 months, or for as long as told by your doctor. Put on sunscreen around the scar or cover up the scar.  Keep all follow-up visits as told by your doctor. This is important. Contact a doctor if:  Your have more redness, swelling, or pain around the cut.  You have more fluid or blood coming from the cut.  Your cut feels warm to the touch.  You have pus or a bad smell coming from the cut.  You have a fever or shaking chills.  You feel sick to your stomach (nauseous) or you throw up (  vomit).  You are dizzy.  Your stitches or staples come undone. Get help right away if:  You have a red streak coming from your cut.  Your cut bleeds through the bandage and the bleeding does not stop  with gentle pressure.  The edges of your cut open up and separate.  You have very bad (severe) pain.  You have a rash.  You are confused.  You pass out (faint).  You have trouble breathing and you have a fast heartbeat. This information is not intended to replace advice given to you by your health care provider. Make sure you discuss any questions you have with your health care provider. Document Revised: 02/20/2017 Document Reviewed: 06/10/2016 Elsevier Patient Education  2020 [employer].  Ganglion Cyst  A ganglion cyst is a non-cancerous, fluid-filled lump that occurs near a joint or tendon. The cyst grows out of a joint or the lining of a tendon. Ganglion cysts most often develop in the hand or wrist, but they can also develop in the shoulder, elbow, hip, knee, ankle, or foot. Ganglion cysts are ball-shaped or egg-shaped. Their size can range from the size of a pea to larger than a grape. Increased activity may cause the cyst to get bigger because more fluid starts to build up. What are the causes? The exact cause of this condition is not known, but it may be related to:  Inflammation or irritation around the joint.  An injury.  Repetitive movements or overuse.  Arthritis. What increases the risk? You are more likely to develop this condition if:  You are a woman.  You are 65-81 years old. What are the signs or symptoms? The main symptom of this condition is a lump. It most often appears on the hand or wrist. In many cases, there are no other symptoms, but a cyst can sometimes cause:  Tingling.  Pain.  Numbness.  Muscle weakness.  Weak grip.  Less range of motion in a joint. How is this diagnosed? Ganglion cysts are usually diagnosed based on a physical exam. Your health care provider will feel the lump and may shine a light next to it. If it is a ganglion cyst, the light will likely shine through it. Your health care provider may order an X-ray, ultrasound,  or MRI to rule out other conditions. How is this treated? Ganglion cysts often go away on their own without treatment. If you have pain or other symptoms, treatment may be needed. Treatment is also needed if the ganglion cyst limits your movement or if it gets infected. Treatment may include:  Wearing a brace or splint on your wrist or finger.  Taking anti-inflammatory medicine.  Having fluid drained from the lump with a needle (aspiration).  Getting a steroid injected into the joint.  Having surgery to remove the ganglion cyst.  Placing a pad on your shoe or wearing shoes that will not rub against the cyst if it is on your foot. Follow these instructions at home:  Do not press on the ganglion cyst, poke it with a needle, or hit it.  Take over-the-counter and prescription medicines only as told by your health care provider.  If you have a brace or splint: ? Wear it as told by your health care provider. ? Remove it as told by your health care provider. Ask if you need to remove it when you take a shower or a bath.  Watch your ganglion cyst for any changes.  Keep all follow-up visits as  told by your health care provider. This is important. Contact a health care provider if:  Your ganglion cyst becomes larger or more painful.  You have pus coming from the lump.  You have weakness or numbness in the affected area.  You have a fever or chills. Get help right away if:  You have a fever and have any of these in the cyst area: ? Increased redness. ? Red streaks. ? Swelling. Summary  A ganglion cyst is a non-cancerous, fluid-filled lump that occurs near a joint or tendon.  Ganglion cysts most often develop in the hand or wrist, but they can also develop in the shoulder, elbow, hip, knee, ankle, or foot.  Ganglion cysts often go away on their own without treatment. This information is not intended to replace advice given to you by your health care provider. Make sure you  discuss any questions you have with your health care provider. Document Revised: 09/15/2017 Document Reviewed: 06/02/2017 Elsevier Patient Education  [place] After These instructions provide you with information about caring for yourself after your procedure. Your health care provider may also give you more specific instructions. Your treatment has been planned according to current medical practices, but problems sometimes occur. Call your health care provider if you have any problems or questions after your procedure. What can I expect after the procedure? After your procedure, you may:  Feel sleepy for several hours.  Feel clumsy and have poor balance for several hours.  Feel forgetful about what happened after the procedure.  Have poor judgment for several hours.  Feel nauseous or vomit.  Have a sore throat if you had a breathing tube during the procedure. Follow these instructions at home: For at least 24 hours after the procedure:      Have a responsible adult stay with you. It is important to have someone help care for you until you are awake and alert.  Rest as needed.  Do not: ? Participate in activities in which you could fall or become injured. ? Drive. ? Use heavy machinery. ? Drink alcohol. ? Take sleeping pills or medicines that cause drowsiness. ? Make important decisions or sign legal documents. ? Take care of children on your own. Eating and drinking  Follow the diet that is recommended by your health care provider.  If you vomit, drink water, juice, or soup when you can drink without vomiting.  Make sure you have little or no nausea before eating solid foods. General instructions  Take over-the-counter and prescription medicines only as told by your health care provider.  If you have sleep apnea, surgery and certain medicines can increase your risk for breathing problems. Follow instructions from your  health care provider about wearing your sleep device: ? Anytime you are sleeping, including during daytime naps. ? While taking prescription pain medicines, sleeping medicines, or medicines that make you drowsy.  If you smoke, do not smoke without supervision.  Keep all follow-up visits as told by your health care provider. This is important. Contact a health care provider if:  You keep feeling nauseous or you keep vomiting.  You feel light-headed.  You develop a rash.  You have a fever. Get help right away if:  You have trouble breathing. Summary  For several hours after your procedure, you may feel sleepy and have poor judgment.  Have a responsible adult stay with you for at least 24 hours or until you are awake and alert. This  information is not intended to replace advice given to you by your health care provider. Make sure you discuss any questions you have with your health care provider. Document Revised: 01/01/2018 Document Reviewed: 01/24/2016 Elsevier Patient Education  [place]. How to Use Chlorhexidine for Bathing Chlorhexidine gluconate (CHG) is a germ-killing (antiseptic) solution that is used to clean the skin. It can get rid of the bacteria that normally live on the skin and can keep them away for about 24 hours. To clean your skin with CHG, you may be given:  A CHG solution to use in the shower or as part of a sponge bath.  A prepackaged cloth that contains CHG. Cleaning your skin with CHG may help lower the risk for infection:  While you are staying in the intensive care unit of the hospital.  If you have a vascular access, such as a central line, to provide short-term or long-term access to your veins.  If you have a catheter to drain urine from your bladder.  If you are on a ventilator. A ventilator is a machine that helps you breathe by moving air in and out of your lungs.  After surgery. What are the risks? Risks of using CHG include:  A  skin reaction.  Hearing loss, if CHG gets in your ears.  Eye injury, if CHG gets in your eyes and is not rinsed out.  The CHG product catching fire. Make sure that you avoid smoking and flames after applying CHG to your skin. Do not use CHG:  If you have a chlorhexidine allergy or have previously reacted to chlorhexidine.  On babies younger than 23 months of age. How to use CHG solution  Use CHG only as told by your health care provider, and follow the instructions on the label.  Use the full amount of CHG as directed. Usually, this is one bottle. During a shower Follow these steps when using CHG solution during a shower (unless your health care provider gives you different instructions): 1. Start the shower. 2. Use your normal soap and shampoo to wash your face and hair. 3. Turn off the shower or move out of the shower stream. 4. Pour the CHG onto a clean washcloth. Do not use any type of brush or rough-edged sponge. 5. Starting at your neck, lather your body down to your toes. Make sure you follow these instructions: ? If you will be having surgery, pay special attention to the part of your body where you will be having surgery. Scrub this area for at least 1 minute. ? Do not use CHG on your head or face. If the solution gets into your ears or eyes, rinse them well with water. ? Avoid your genital area. ? Avoid any areas of skin that have broken skin, cuts, or scrapes. ? Scrub your back and under your arms. Make sure to wash skin folds. 6. Let the lather sit on your skin for 1-2 minutes or as long as told by your health care provider. 7. Thoroughly rinse your entire body in the shower. Make sure that all body creases and crevices are rinsed well. 8. Dry off with a clean towel. Do not put any substances on your body afterward--such as powder, lotion, or perfume--unless you are told to do so by your health care provider. Only use lotions that are recommended by the  manufacturer. 9. Put on clean clothes or pajamas. 10. If it is the night before your surgery, sleep in clean sheets.  During a sponge bath Follow these steps when using CHG solution during a sponge bath (unless your health care provider gives you different instructions): 1. Use your normal soap and shampoo to wash your face and hair. 2. Pour the CHG onto a clean washcloth. 3. Starting at your neck, lather your body down to your toes. Make sure you follow these instructions: ? If you will be having surgery, pay special attention to the part of your body where you will be having surgery. Scrub this area for at least 1 minute. ? Do not use CHG on your head or face. If the solution gets into your ears or eyes, rinse them well with water. ? Avoid your genital area. ? Avoid any areas of skin that have broken skin, cuts, or scrapes. ? Scrub your back and under your arms. Make sure to wash skin folds. 4. Let the lather sit on your skin for 1-2 minutes or as long as told by your health care provider. 5. Using a different clean, wet washcloth, thoroughly rinse your entire body. Make sure that all body creases and crevices are rinsed well. 6. Dry off with a clean towel. Do not put any substances on your body afterward--such as powder, lotion, or perfume--unless you are told to do so by your health care provider. Only use lotions that are recommended by the manufacturer. 7. Put on clean clothes or pajamas. 8. If it is the night before your surgery, sleep in clean sheets. How to use CHG prepackaged cloths  Only use CHG cloths as told by your health care provider, and follow the instructions on the label.  Use the CHG cloth on clean, dry skin.  Do not use the CHG cloth on your head or face unless your health care provider tells you to.  When washing with the CHG cloth: ? Avoid your genital area. ? Avoid any areas of skin that have broken skin, cuts, or scrapes. Before surgery Follow these steps when  using a CHG cloth to clean before surgery (unless your health care provider gives you different instructions): 1. Using the CHG cloth, vigorously scrub the part of your body where you will be having surgery. Scrub using a back-and-forth motion for 3 minutes. The area on your body should be completely wet with CHG when you are done scrubbing. 2. Do not rinse. Discard the cloth and let the area air-dry. Do not put any substances on the area afterward, such as powder, lotion, or perfume. 3. Put on clean clothes or pajamas. 4. If it is the night before your surgery, sleep in clean sheets.  For general bathing Follow these steps when using CHG cloths for general bathing (unless your health care provider gives you different instructions). 1. Use a separate CHG cloth for each area of your body. Make sure you wash between any folds of skin and between your fingers and toes. Wash your body in the following order, switching to a new cloth after each step: ? The front of your neck, shoulders, and chest. ? Both of your arms, under your arms, and your hands. ? Your stomach and groin area, avoiding the genitals. ? Your right leg and foot. ? Your left leg and foot. ? The back of your neck, your back, and your buttocks. 2. Do not rinse. Discard the cloth and let the area air-dry. Do not put any substances on your body afterward--such as powder, lotion, or perfume--unless you are told to do so by your health care provider.  Only use lotions that are recommended by the manufacturer. 3. Put on clean clothes or pajamas. Contact a health care provider if:  Your skin gets irritated after scrubbing.  You have questions about using your solution or cloth. Get help right away if:  Your eyes become very red or swollen.  Your eyes itch badly.  Your skin itches badly and is red or swollen.  Your hearing changes.  You have trouble seeing.  You have swelling or tingling in your mouth or throat.  You have  trouble breathing.  You swallow any chlorhexidine. Summary  Chlorhexidine gluconate (CHG) is a germ-killing (antiseptic) solution that is used to clean the skin. Cleaning your skin with CHG may help to lower your risk for infection.  You may be given CHG to use for bathing. It may be in a bottle or in a prepackaged cloth to use on your skin. Carefully follow your health care provider's instructions and the instructions on the product label.  Do not use CHG if you have a chlorhexidine allergy.  Contact your health care provider if your skin gets irritated after scrubbing. This information is not intended to replace advice given to you by your health care provider. Make sure you discuss any questions you have with your health care provider. Document Revised: 12/20/2018 Document Reviewed: 08/31/2017 Elsevier Patient Education  [place].

## 2020-09-21 NOTE — Progress Notes (Signed)
Chief Complaint:   OBESITY Holly Krueger is here to discuss her progress with her obesity treatment plan along with follow-up of her obesity related diagnoses. Holly Krueger is on the Category 2 Plan and states she is following her eating plan approximately 80% of the time. Holly Krueger states she is doing 0 minutes 0 times per week.  Today's visit was #: 6 Starting weight: 244 lbs Starting date: 06/18/2020 Today's weight: 232 lbs Today's date: 09/14/2020 Total lbs lost to date: 12 Total lbs lost since last in-office visit: 2  Interim History: Holly Krueger continues to do well with weight loss even over Thanksgiving. She has done well with portion control and she is working on increasing lean protein. Her hunger is controlled and she is dealing with work temptations well.  Subjective:   1. Essential hypertension Holly Krueger is on metoprolol and her blood pressure is normally well controlled, but it is elevated today. She had a breakthrough episode of aFib this weekend which has resolved.  Assessment/Plan:   1. Essential hypertension Holly Krueger is to continue with diet and weight loss, and we will recheck her blood pressure in 2-3 weeks to make sure this isn't the start of a elevated blood pressure trend. She will watch for signs of hypotension as she continues her lifestyle modifications.  2. Class 3 severe obesity with serious comorbidity and body mass index (BMI) of 40.0 to 44.9 in adult, unspecified obesity type (HCC) Holly Krueger is currently in the action stage of change. As such, her goal is to continue with weight loss efforts. She has agreed to the Category 2 Plan.   Soup recipes were giving today.  We will recheck fasting labs at her next visit.  Behavioral modification strategies: meal planning and cooking strategies and holiday eating strategies .  Holly Krueger has agreed to follow-up with our clinic in 2 to 3 weeks. She was informed of the importance of frequent follow-up visits to maximize her success with  intensive lifestyle modifications for her multiple health conditions.   Objective:   Blood pressure (!) 153/77, pulse 60, temperature (!) 97.5 F (36.4 C), temperature source Oral, height 5\' 3"  (1.6 m), weight 232 lb (105.2 kg), SpO2 100 %. Body mass index is 41.1 kg/m.  General: Cooperative, alert, well developed, in no acute distress. HEENT: Conjunctivae and lids unremarkable. Cardiovascular: Regular rhythm.  Lungs: Normal work of breathing. Neurologic: No focal deficits.   Lab Results  Component Value Date   CREATININE 0.80 06/18/2020   BUN 9 06/18/2020   NA 142 06/18/2020   K 4.6 06/18/2020   CL 104 06/18/2020   CO2 23 06/18/2020   Lab Results  Component Value Date   ALT 19 06/18/2020   AST 18 06/18/2020   ALKPHOS 116 06/18/2020   BILITOT 0.4 06/18/2020   Lab Results  Component Value Date   HGBA1C 5.9 (H) 06/18/2020   Lab Results  Component Value Date   INSULIN 15.7 06/18/2020   Lab Results  Component Value Date   TSH 2.700 06/18/2020   Lab Results  Component Value Date   CHOL 214 (H) 06/18/2020   HDL 37 (L) 06/18/2020   LDLCALC 154 (H) 06/18/2020   TRIG 126 06/18/2020   Lab Results  Component Value Date   WBC 6.3 06/18/2020   HGB 11.5 06/18/2020   HCT 36.9 06/18/2020   MCV 82 06/18/2020   PLT 261 06/18/2020   No results found for: IRON, TIBC, FERRITIN  Attestation Statements:   Reviewed by clinician on day of  visit: allergies, medications, problem list, medical history, surgical history, family history, social history, and previous encounter notes.  Time spent on visit including pre-visit chart review and post-visit care and charting was 22 minutes.    I, Trixie Dredge, am acting as transcriptionist for Dennard Nip, MD.  I have reviewed the above documentation for accuracy and completeness, and I agree with the above. -  Dennard Nip, MD

## 2020-09-22 ENCOUNTER — Other Ambulatory Visit: Payer: Self-pay

## 2020-09-22 ENCOUNTER — Other Ambulatory Visit (HOSPITAL_COMMUNITY)
Admission: RE | Admit: 2020-09-22 | Discharge: 2020-09-22 | Disposition: A | Discharge status: Home / Self Care | Payer: 59 | Source: Ambulatory Visit | Attending: Podiatry | Admitting: Podiatry

## 2020-09-22 ENCOUNTER — Encounter (HOSPITAL_COMMUNITY)
Admission: RE | Admit: 2020-09-22 | Discharge: 2020-09-22 | Disposition: A | Discharge status: Home / Self Care | Payer: 59 | Source: Ambulatory Visit | Attending: Podiatry | Admitting: Podiatry

## 2020-09-22 ENCOUNTER — Encounter (HOSPITAL_COMMUNITY): Payer: Self-pay

## 2020-09-22 DIAGNOSIS — Z01812 Encounter for preprocedural laboratory examination: Secondary | ICD-10-CM | POA: Insufficient documentation

## 2020-09-22 DIAGNOSIS — Z20822 Contact with and (suspected) exposure to covid-19: Secondary | ICD-10-CM | POA: Diagnosis not present

## 2020-09-22 LAB — SARS CORONAVIRUS 2 (TAT 6-24 HRS): SARS Coronavirus 2: NEGATIVE

## 2020-09-23 ENCOUNTER — Ambulatory Visit (HOSPITAL_COMMUNITY): Payer: 59 | Admitting: Anesthesiology

## 2020-09-23 ENCOUNTER — Ambulatory Visit (HOSPITAL_COMMUNITY)
Admission: RE | Admit: 2020-09-23 | Discharge: 2020-09-23 | Disposition: A | Discharge status: Home / Self Care | Payer: 59 | Attending: Podiatry | Admitting: Podiatry

## 2020-09-23 ENCOUNTER — Encounter (HOSPITAL_COMMUNITY)
Admission: RE | Disposition: A | Discharge status: Home / Self Care | Payer: Self-pay | Source: Home / Self Care | Attending: Podiatry

## 2020-09-23 ENCOUNTER — Other Ambulatory Visit: Payer: Self-pay

## 2020-09-23 ENCOUNTER — Encounter (HOSPITAL_COMMUNITY): Payer: Self-pay | Admitting: Podiatry

## 2020-09-23 DIAGNOSIS — D1779 Benign lipomatous neoplasm of other sites: Secondary | ICD-10-CM | POA: Insufficient documentation

## 2020-09-23 DIAGNOSIS — Z79899 Other long term (current) drug therapy: Secondary | ICD-10-CM | POA: Diagnosis not present

## 2020-09-23 DIAGNOSIS — Z9889 Other specified postprocedural states: Secondary | ICD-10-CM

## 2020-09-23 DIAGNOSIS — M67471 Ganglion, right ankle and foot: Secondary | ICD-10-CM | POA: Diagnosis present

## 2020-09-23 HISTORY — PX: GANGLION CYST EXCISION: SHX1691

## 2020-09-23 SURGERY — EXCISION, GANGLION CYST, FOOT
Anesthesia: General | Site: Foot | Laterality: Right

## 2020-09-23 MED ORDER — DEXAMETHASONE SODIUM PHOSPHATE 10 MG/ML IJ SOLN
INTRAMUSCULAR | Status: AC
  Filled 2020-09-23: qty 1

## 2020-09-23 MED ORDER — BUPIVACAINE HCL (PF) 0.5 % IJ SOLN
INTRAMUSCULAR | Status: DC | PRN
  Administered 2020-09-23: 8 mL

## 2020-09-23 MED ORDER — LIDOCAINE HCL (PF) 2 % IJ SOLN
INTRAMUSCULAR | Status: AC
  Filled 2020-09-23: qty 5

## 2020-09-23 MED ORDER — MIDAZOLAM HCL 5 MG/5ML IJ SOLN
INTRAMUSCULAR | Status: DC | PRN
  Administered 2020-09-23 (×2): 1 mg via INTRAVENOUS

## 2020-09-23 MED ORDER — HYDROMORPHONE HCL 1 MG/ML IJ SOLN: 0.2500 mg | INTRAMUSCULAR | Status: DC | PRN

## 2020-09-23 MED ORDER — FENTANYL CITRATE (PF) 100 MCG/2ML IJ SOLN
INTRAMUSCULAR | Status: DC | PRN
Start: 2020-09-23 — End: 2020-09-23
  Administered 2020-09-23 (×4): 25 ug via INTRAVENOUS

## 2020-09-23 MED ORDER — PROPOFOL 500 MG/50ML IV EMUL
INTRAVENOUS | Status: DC | PRN
  Administered 2020-09-23: 100 ug/kg/min via INTRAVENOUS

## 2020-09-23 MED ORDER — ONDANSETRON HCL 4 MG/2ML IJ SOLN
INTRAMUSCULAR | Status: DC | PRN
  Administered 2020-09-23: 4 mg via INTRAVENOUS

## 2020-09-23 MED ORDER — 0.9 % SODIUM CHLORIDE (POUR BTL) OPTIME
TOPICAL | Status: DC | PRN
  Administered 2020-09-23: 1000 mL

## 2020-09-23 MED ORDER — PHENYLEPHRINE 40 MCG/ML (10ML) SYRINGE FOR IV PUSH (FOR BLOOD PRESSURE SUPPORT)
PREFILLED_SYRINGE | INTRAVENOUS | Status: AC
  Filled 2020-09-23: qty 10

## 2020-09-23 MED ORDER — ORAL CARE MOUTH RINSE
15.0000 mL | Freq: Once | OROMUCOSAL | Status: AC
  Administered 2020-09-23: 15 mL via OROMUCOSAL

## 2020-09-23 MED ORDER — CHLORHEXIDINE GLUCONATE 0.12 % MT SOLN: 15.0000 mL | Freq: Once | OROMUCOSAL | Status: AC

## 2020-09-23 MED ORDER — CEFAZOLIN SODIUM-DEXTROSE 2-4 GM/100ML-% IV SOLN
2.0000 g | Freq: Once | INTRAVENOUS | Status: AC
  Administered 2020-09-23: 2 g via INTRAVENOUS

## 2020-09-23 MED ORDER — PROPOFOL 10 MG/ML IV BOLUS
INTRAVENOUS | Status: AC
  Filled 2020-09-23: qty 40

## 2020-09-23 MED ORDER — FENTANYL CITRATE (PF) 100 MCG/2ML IJ SOLN
INTRAMUSCULAR | Status: AC
  Filled 2020-09-23: qty 2

## 2020-09-23 MED ORDER — LIDOCAINE HCL (PF) 1 % IJ SOLN
INTRAMUSCULAR | Status: AC
  Filled 2020-09-23: qty 30

## 2020-09-23 MED ORDER — ONDANSETRON HCL 4 MG/2ML IJ SOLN
INTRAMUSCULAR | Status: AC
  Filled 2020-09-23: qty 2

## 2020-09-23 MED ORDER — LACTATED RINGERS IV SOLN: Freq: Once | INTRAVENOUS | Status: AC

## 2020-09-23 MED ORDER — CEFAZOLIN SODIUM-DEXTROSE 2-4 GM/100ML-% IV SOLN
INTRAVENOUS | Status: AC
  Filled 2020-09-23: qty 100

## 2020-09-23 MED ORDER — LACTATED RINGERS IV SOLN: INTRAVENOUS | Status: DC | PRN

## 2020-09-23 MED ORDER — BUPIVACAINE HCL (PF) 0.5 % IJ SOLN
INTRAMUSCULAR | Status: AC
  Filled 2020-09-23: qty 30

## 2020-09-23 MED ORDER — PHENYLEPHRINE HCL (PRESSORS) 10 MG/ML IV SOLN
INTRAVENOUS | Status: DC | PRN
  Administered 2020-09-23: 120 ug via INTRAVENOUS
  Administered 2020-09-23 (×2): 40 ug via INTRAVENOUS
  Administered 2020-09-23: 120 ug via INTRAVENOUS
  Administered 2020-09-23: 80 ug via INTRAVENOUS

## 2020-09-23 MED ORDER — DEXAMETHASONE SODIUM PHOSPHATE 10 MG/ML IJ SOLN
INTRAMUSCULAR | Status: DC | PRN
  Administered 2020-09-23: 5 mg via INTRAVENOUS

## 2020-09-23 MED ORDER — MEPERIDINE HCL 50 MG/ML IJ SOLN: 6.2500 mg | INTRAMUSCULAR | Status: DC | PRN

## 2020-09-23 MED ORDER — MIDAZOLAM HCL 2 MG/2ML IJ SOLN
INTRAMUSCULAR | Status: AC
  Filled 2020-09-23: qty 2

## 2020-09-23 MED ORDER — PHENYLEPHRINE HCL (PRESSORS) 10 MG/ML IV SOLN
INTRAVENOUS | Status: AC
  Filled 2020-09-23: qty 1

## 2020-09-23 MED ORDER — ONDANSETRON HCL 4 MG/2ML IJ SOLN: 4.0000 mg | Freq: Once | INTRAMUSCULAR | Status: DC | PRN

## 2020-09-23 MED ORDER — PROPOFOL 10 MG/ML IV BOLUS
INTRAVENOUS | Status: DC | PRN
  Administered 2020-09-23: 30 mg via INTRAVENOUS
  Administered 2020-09-23: 50 mg via INTRAVENOUS

## 2020-09-23 SURGICAL SUPPLY — 44 items
APL PRP STRL LF DISP 70% ISPRP (MISCELLANEOUS) ×1
APL SKNCLS STERI-STRIP NONHPOA (GAUZE/BANDAGES/DRESSINGS) ×1
BANDAGE ELASTIC 4 VELCRO NS (GAUZE/BANDAGES/DRESSINGS) ×3 IMPLANT
BANDAGE ESMARK 4X12 BL STRL LF (DISPOSABLE) ×1 IMPLANT
BENZOIN TINCTURE PRP APPL 2/3 (GAUZE/BANDAGES/DRESSINGS) ×3 IMPLANT
BLADE SURG 15 STRL LF DISP TIS (BLADE) ×1 IMPLANT
BLADE SURG 15 STRL SS (BLADE) ×3
BNDG CMPR 12X4 ELC STRL LF (DISPOSABLE) ×1
BNDG CMPR STD VLCR NS LF 5.8X4 (GAUZE/BANDAGES/DRESSINGS) ×1
BNDG CONFORM 2 STRL LF (GAUZE/BANDAGES/DRESSINGS) ×3 IMPLANT
BNDG ELASTIC 4X5.8 VLCR NS LF (GAUZE/BANDAGES/DRESSINGS) ×3 IMPLANT
BNDG ESMARK 4X12 BLUE STRL LF (DISPOSABLE) ×3
BNDG GAUZE ELAST 4 BULKY (GAUZE/BANDAGES/DRESSINGS) ×3 IMPLANT
CHLORAPREP W/TINT 26 (MISCELLANEOUS) ×3 IMPLANT
CLOSURE WOUND 1/2 X4 (GAUZE/BANDAGES/DRESSINGS) ×1
CLOTH BEACON ORANGE TIMEOUT ST (SAFETY) ×3 IMPLANT
COVER LIGHT HANDLE STERIS (MISCELLANEOUS) ×6 IMPLANT
COVER WAND RF STERILE (DRAPES) ×3 IMPLANT
CUFF TOURN SGL QUICK 18X4 (TOURNIQUET CUFF) ×3 IMPLANT
DECANTER SPIKE VIAL GLASS SM (MISCELLANEOUS) ×3 IMPLANT
DRSG ADAPTIC 3X8 NADH LF (GAUZE/BANDAGES/DRESSINGS) ×3 IMPLANT
ELECT REM PT RETURN 9FT ADLT (ELECTROSURGICAL) ×3
ELECTRODE REM PT RTRN 9FT ADLT (ELECTROSURGICAL) ×1 IMPLANT
GAUZE KERLIX 2X3 DERM STRL LF (GAUZE/BANDAGES/DRESSINGS) ×3 IMPLANT
GAUZE SPONGE 4X4 12PLY STRL (GAUZE/BANDAGES/DRESSINGS) ×3 IMPLANT
GLOVE BIO SURGEON STRL SZ7.5 (GLOVE) ×3 IMPLANT
GLOVE BIOGEL PI IND STRL 7.0 (GLOVE) ×2 IMPLANT
GLOVE BIOGEL PI INDICATOR 7.0 (GLOVE) ×4
GOWN STRL REUS W/TWL LRG LVL3 (GOWN DISPOSABLE) ×6 IMPLANT
KIT TURNOVER KIT A (KITS) ×3 IMPLANT
MANIFOLD NEPTUNE II (INSTRUMENTS) ×3 IMPLANT
NEEDLE HYPO 27GX1-1/4 (NEEDLE) ×6 IMPLANT
NS IRRIG 1000ML POUR BTL (IV SOLUTION) ×3 IMPLANT
PACK BASIC LIMB (CUSTOM PROCEDURE TRAY) ×3 IMPLANT
PAD ARMBOARD 7.5X6 YLW CONV (MISCELLANEOUS) ×3 IMPLANT
SET BASIN LINEN APH (SET/KITS/TRAYS/PACK) ×3 IMPLANT
SPONGE GAUZE 4X4 12PLY (GAUZE/BANDAGES/DRESSINGS) ×3 IMPLANT
SPONGE LAP 18X18 RF (DISPOSABLE) ×3 IMPLANT
STRIP CLOSURE SKIN 1/2X4 (GAUZE/BANDAGES/DRESSINGS) ×2 IMPLANT
SUT PROLENE 4 0 PS 2 18 (SUTURE) ×3 IMPLANT
SUT VIC AB 4-0 PS2 27 (SUTURE) ×3 IMPLANT
SUT VICRYL AB 3-0 FS1 BRD 27IN (SUTURE) IMPLANT
SYR CONTROL 10ML LL (SYRINGE) ×6 IMPLANT
YANKAUER SUCT BULB TIP NO VENT (SUCTIONS) ×3 IMPLANT

## 2020-09-23 NOTE — Brief Op Note (Signed)
BRIEF OPERATIVE NOTE  DATE OF PROCEDURE 09/23/2020  SURGEON Marcheta Grammes, DPM  ASSISTANT SURGEON None  OR STAFF Circulator: Dechert, Loralyn Freshwater, RN Scrub Person: Claudine Mouton Float Surgical Tech: Karin Lieu, CST   PREOPERATIVE DIAGNOSIS 1.  Ganglion cyst, right foot 2.  Pain, right foot  POSTOPERATIVE DIAGNOSIS 1.  Lipoma, right foot 2.  Pain, right foot  PROCEDURE Excision of lipoma, right foot  ANESTHESIA Monitor Anesthesia Care   HEMOSTASIS Pneumatic ankle tourniquet set at 250 mmHg  ESTIMATED BLOOD LOSS Minimal (<5 cc)  MATERIALS USED None  INJECTABLES 0.5% Marcaine plain  PATHOLOGY Soft tissue consistent with lipoma  COMPLICATIONS None

## 2020-09-23 NOTE — Discharge Instructions (Addendum)

## 2020-09-23 NOTE — Transfer of Care (Signed)
Immediate Anesthesia Transfer of Care Note  Patient: Holly Krueger  Procedure(s) Performed: EXCISION LIPOMA RIGHT FOOT (Right Foot)  Patient Location: PACU  Anesthesia Type:General  Level of Consciousness: awake, alert , oriented and patient cooperative  Airway & Oxygen Therapy: Patient Spontanous Breathing  Post-op Assessment: Report given to RN  Post vital signs: Reviewed and stable  Last Vitals:  Vitals Value Taken Time  BP 111/68 09/23/20 0940  Temp    Pulse 63 09/23/20 0942  Resp 18 09/23/20 0942  SpO2 95 % 09/23/20 0942  Vitals shown include unvalidated device data.  Last Pain:  Vitals:   09/23/20 0722  TempSrc: Oral  PainSc: 0-No pain         Complications: No complications documented.

## 2020-09-23 NOTE — H&P (Signed)
HISTORY AND PHYSICAL INTERVAL NOTE:  09/23/2020  8:22 AM  Holly Krueger  has presented today for surgery, with the diagnosis of ganglion cyst of right foot.  The various methods of treatment have been discussed with the patient.  No guarantees were given.  After consideration of risks, benefits and other options for treatment, the patient has consented to surgery.  I have reviewed the patients' chart and labs.    Patient Vitals for the past 24 hrs:  BP Temp Temp src Pulse Resp SpO2  09/23/20 0722 121/61 98.4 F (36.9 C) Oral 76 19 100 %    A history and physical examination was performed in my office.  The patient was reexamined.  There have been no changes to this history and physical examination.  Marcheta Grammes, DPM

## 2020-09-23 NOTE — Anesthesia Postprocedure Evaluation (Signed)
Anesthesia Post Note  Patient: Holly Krueger  Procedure(s) Performed: EXCISION LIPOMA RIGHT FOOT (Right Foot)  Patient location during evaluation: PACU Anesthesia Type: General Level of consciousness: awake and alert and oriented Pain management: pain level controlled Vital Signs Assessment: post-procedure vital signs reviewed and stable Respiratory status: spontaneous breathing and respiratory function stable Cardiovascular status: blood pressure returned to baseline and stable Postop Assessment: no apparent nausea or vomiting Anesthetic complications: no   No complications documented.   Last Vitals:  Vitals:   09/23/20 1000 09/23/20 1022  BP: 128/70 124/67  Pulse: 72 62  Resp: 15 16  Temp:  37.2 C  SpO2: 98% 100%    Last Pain:  Vitals:   09/23/20 1022  TempSrc: Oral  PainSc: 0-No pain                 [name] C Zigmond Trela

## 2020-09-23 NOTE — Anesthesia Preprocedure Evaluation (Signed)
Anesthesia Evaluation  Patient identified by MRN, date of birth, ID band Patient awake    Reviewed: Allergy & Precautions, NPO status , Patient's Chart, lab work & pertinent test results  History of Anesthesia Complications (+) PONV and history of anesthetic complications  Airway Mallampati: II  TM Distance: >3 FB Neck ROM: Full    Dental  (+) Dental Advisory Given, Teeth Intact   Pulmonary shortness of breath and with exertion,    Pulmonary exam normal breath sounds clear to auscultation       Cardiovascular hypertension, Pt. on medications Normal cardiovascular exam+ dysrhythmias Atrial Fibrillation  Rhythm:Regular Rate:Normal     Neuro/Psych  Headaches,    GI/Hepatic Neg liver ROS, GERD  Medicated,  Endo/Other  negative endocrine ROS  Renal/GU Renal disease     Musculoskeletal negative musculoskeletal ROS (+)   Abdominal   Peds  Hematology negative hematology ROS (+)   Anesthesia Other Findings   Reproductive/Obstetrics negative OB ROS                            Anesthesia Physical Anesthesia Plan  ASA: III  Anesthesia Plan: General   Post-op Pain Management:    Induction: Intravenous  PONV Risk Score and Plan: Ondansetron, Dexamethasone and Midazolam  Airway Management Planned: Nasal Cannula, Natural Airway and Simple Face Mask  Additional Equipment:   Intra-op Plan:   Post-operative Plan:   Informed Consent:     Dental advisory given  Plan Discussed with: CRNA and Surgeon  Anesthesia Plan Comments:         Anesthesia Quick Evaluation

## 2020-09-23 NOTE — Op Note (Signed)
OPERATIVE NOTE  DATE OF PROCEDURE 09/23/2020  SURGEON Marcheta Grammes, DPM  ASSISTANT SURGEON None  OR STAFF Circulator: Dechert, Loralyn Freshwater, RN Scrub Person: Claudine Mouton Float Surgical Tech: Karin Lieu, CST   PREOPERATIVE DIAGNOSIS 1.  Ganglion cyst, right foot 2.  Pain, right foot  POSTOPERATIVE DIAGNOSIS 1.  Lipoma, right foot 2.  Pain, right foot  PROCEDURE Excision of lipoma, right foot  ANESTHESIA Monitor Anesthesia Care   HEMOSTASIS Pneumatic ankle tourniquet set at 250 mmHg  ESTIMATED BLOOD LOSS Minimal (<5 cc)  MATERIALS USED None  INJECTABLES 0.5% Marcaine plain  PATHOLOGY Soft tissue consistent with lipoma  COMPLICATIONS None  DESCRIPTION OF THE PROCEDURE:  The patient was brought to the operating room and placed on the operative table in the supine position.  A pneumatic ankle tourniquet was placed about the patient's right ankle.  A timeout was performed.  The foot was anesthetized with 0.5% Marcaine plain.  A second timeout was performed.  The foot was elevated and exsanguinated.  The pneumatic ankle tourniquet was inflated to 250 mmHg.  Attention was directed to the dorsal lateral aspect of the right foot overlying the soft tissue mass in question.  A curvilinear incision was made overlying the mass.  The incision was deepened through the subcutaneous tissues.  A firm mass of yellow tissue was encountered consistent with a lipoma.  The specimen was passed from the operative field and sent to pathology for evaluation.  No ganglionic cyst was identified.  There was mild dorsal spurring of the calcaneocuboid joint.  The area was smoothed with a power rasp.  The surgical wound was irrigated with copious amounts of sterile irrigant.    subcutaneous structures were reapproximated using 4-0 Vicryl.  The skin was reapproximated using 4-0 Vicryl in a running subcuticular manner.  Skin closure was reinforced with 4-0 Prolene in a simple suture  technique.  Steri-Strips were applied.  A sterile compressive dressing was applied.  The patient tolerated the procedure and anesthesia well.  She was transferred from the operating room to the postanesthesia care unit with vital signs stable and vascular status intact to all digits of the right foot.

## 2020-09-24 ENCOUNTER — Encounter (HOSPITAL_COMMUNITY): Payer: Self-pay | Admitting: Podiatry

## 2020-09-24 LAB — SURGICAL PATHOLOGY

## 2020-10-06 ENCOUNTER — Encounter (INDEPENDENT_AMBULATORY_CARE_PROVIDER_SITE_OTHER): Payer: Self-pay | Admitting: Family Medicine

## 2020-10-06 ENCOUNTER — Other Ambulatory Visit: Payer: Self-pay

## 2020-10-06 ENCOUNTER — Ambulatory Visit (INDEPENDENT_AMBULATORY_CARE_PROVIDER_SITE_OTHER): Payer: 59 | Admitting: Family Medicine

## 2020-10-06 VITALS — BP 110/71 | HR 73 | Temp 97.8°F | Ht 63.0 in | Wt 224.0 lb

## 2020-10-06 DIAGNOSIS — E7849 Other hyperlipidemia: Secondary | ICD-10-CM

## 2020-10-06 DIAGNOSIS — Z9189 Other specified personal risk factors, not elsewhere classified: Secondary | ICD-10-CM | POA: Insufficient documentation

## 2020-10-06 DIAGNOSIS — F3289 Other specified depressive episodes: Secondary | ICD-10-CM | POA: Diagnosis not present

## 2020-10-06 DIAGNOSIS — R7303 Prediabetes: Secondary | ICD-10-CM

## 2020-10-06 DIAGNOSIS — F32A Depression, unspecified: Secondary | ICD-10-CM | POA: Insufficient documentation

## 2020-10-06 DIAGNOSIS — Z6839 Body mass index (BMI) 39.0-39.9, adult: Secondary | ICD-10-CM

## 2020-10-06 DIAGNOSIS — E559 Vitamin D deficiency, unspecified: Secondary | ICD-10-CM

## 2020-10-06 MED ORDER — TOPIRAMATE 25 MG PO TABS: 25.0000 mg | ORAL_TABLET | Freq: Every day | ORAL | 0 refills | Status: DC

## 2020-10-06 MED ORDER — VITAMIN D (ERGOCALCIFEROL) 1.25 MG (50000 UNIT) PO CAPS: 50000.0000 [IU] | ORAL_CAPSULE | ORAL | 0 refills | Status: DC

## 2020-10-06 NOTE — Progress Notes (Signed)
Chief Complaint:   OBESITY Holly Krueger is here to discuss her progress with her obesity treatment plan along with follow-up of her obesity related diagnoses. Holly Krueger is on the Category 2 Plan and states she is following her eating plan approximately 90% of the time. Holly Krueger states she is exercising 0 minutes 0 times per week.  Today's visit was #: 7 Starting weight: 244 lbs Starting date: 06/18/2020 Today's weight: 224 lbs Today's date: 10/07/2020 Total lbs lost to date: 20 lbs Total lbs lost since last in-office visit: 8 lbs Total weight loss percentage to date: -8.20%  Interim History: Leonard's last OV was 3 weeks ago with Dr. Leafy Ro. She has had a sinus since then and didn't eat much for a week. She reports no issues with the plan. Holly Krueger is fasting today. Per Dr. Migdalia Dk note from previous visit, she needs repeat labs today. Loray has lost over 7% of her initial body weight.  Assessment/Plan:   1. Pre-diabetes Discussed labs with patient today.  Holly Krueger is not taking any medication to aid in diabetes prevention. She has a diagnosis of prediabetes based on her elevated HgA1c and was informed this puts her at greater risk of developing diabetes. She continues to work on diet and exercise to decrease her risk of diabetes. She denies nausea or hypoglycemia.  Lab Results  Component Value Date   HGBA1C 5.8 (H) 10/06/2020   Lab Results  Component Value Date   INSULIN 12.9 10/06/2020   INSULIN 15.7 06/18/2020   Plan: Check labs today. I provided extensive education regarding disease process and treatment options. We will discuss labs and treatment plan at her next OV. Holly Krueger will continue to work on weight loss, exercise, and decreasing simple carbohydrates to help decrease the risk of diabetes.   Orders - Hemoglobin A1c - Insulin, random  2. Vitamin D deficiency Holly Krueger's Vitamin D level was 12.8 on 06/18/2020. She is currently taking prescription vitamin D 50,000 IU each week. She  denies nausea, vomiting or muscle weakness.   Ref. Range 06/18/2020 11:55  Vitamin D, 25-Hydroxy Latest Ref Range: 30.0 - 100.0 ng/mL 12.8 (L)   Plan: Refill Vit D for 1 month, as per below. Low Vitamin D level contributes to fatigue and are associated with obesity, breast, and colon cancer. She agrees to continue to take prescription Vitamin D @50 ,000 IU every week and will follow-up for routine testing of Vitamin D, at least 2-3 times per year to avoid over-replacement.  Refill- Vitamin D, Ergocalciferol, (DRISDOL) 1.25 MG (50000 UNIT) CAPS capsule; Take 1 capsule (50,000 Units total) by mouth every 7 (seven) days.  Dispense: 4 capsule; Refill: 0  Orders - VITAMIN D 25 Hydroxy (Vit-D Deficiency, Fractures)  3. Other hyperlipidemia Holly Krueger is not on medication to aid in disease prevention. She is trying weight loss and diet control.  Holly Krueger has hyperlipidemia and has been trying to improve her cholesterol levels with intensive lifestyle modification including a low saturated fat diet, exercise and weight loss. She denies any chest pain, claudication or myalgias.  Lab Results  Component Value Date   ALT 19 06/18/2020   AST 18 06/18/2020   ALKPHOS 116 06/18/2020   BILITOT 0.4 06/18/2020   Lab Results  Component Value Date   CHOL 193 10/06/2020   HDL 30 (L) 10/06/2020   LDLCALC 133 (H) 10/06/2020   TRIG 168 (H) 10/06/2020   CHOLHDL 6.4 (H) 10/06/2020   The 10-year ASCVD risk score Mikey Bussing DC Jr., et al., 2013) is: 3.6%  Values used to calculate the score:     Age: 51 years     Sex: Female     Is Non-Hispanic African American: No     Diabetic: No     Tobacco smoker: No     Systolic Blood Pressure: 606 mmHg     Is BP treated: Yes     HDL Cholesterol: 30 mg/dL     Total Cholesterol: 193 mg/dL  Plan: Check labs today. Cardiovascular risk and specific lipid/LDL goals reviewed.  We discussed several lifestyle modifications today and Corlette will continue to work on diet, exercise and  weight loss efforts. Orders and follow up as documented in patient record.   Counseling Intensive lifestyle modifications are the first line treatment for this issue. . Dietary changes: Increase soluble fiber. Decrease simple carbohydrates. . Exercise changes: Moderate to vigorous-intensity aerobic activity 150 minutes per week if tolerated. . Lipid-lowering medications: see documented in medical record.  Orders - Lipid panel  4. Other depression with emotional eating Dr. Leafy Ro started Helene Kelp on Topamax on 07/29/2020. She is tolerating it well and notes that it works as intended, to help emotional eating behavior. Holly Krueger is struggling with emotional eating and using food for comfort to the extent that it is negatively impacting her health. She has been working on behavior modification techniques to help reduce her emotional eating. She shows no sign of suicidal or homicidal ideations.   Plan: Refill Topamax for 1 month, as per below. Behavior modification techniques were discussed today to help Holly Krueger deal with her emotional/non-hunger eating behaviors.  Orders and follow up as documented in patient record.   Refill- topiramate (TOPAMAX) 25 MG tablet; Take 1 tablet (25 mg total) by mouth at bedtime.  Dispense: 30 tablet; Refill: 0  5. At risk for diabetes mellitus - Holly Krueger was given extensive diabetes prevention education and counseling today of more than 27 minutes.  - Counseled patient on pathophysiology of disease and discussed various treatment options which always includes dietary and lifestyle modification as first line.   - Importance of healthy diet with very limited amounts of simple carbohydrates discussed with patient in addition to regular aerobic exercise to an eventual goal of 35min 5d/week or more.  - Handouts provided at patient's desire and or told to go online at the American Diabetes Association website for further information  6. Class 2 severe obesity with serious  comorbidity and body mass index (BMI) of 39.0 to 39.9 in adult, unspecified obesity type (HCC) Holly Krueger is currently in the action stage of change. As such, her goal is to continue with weight loss efforts. She has agreed to the Category 2 Plan.   Exercise goals: As is  Behavioral modification strategies: increasing lean protein intake, decreasing simple carbohydrates, no skipping meals and holiday eating strategies .  Holly Krueger has agreed to follow-up with our clinic in 2 weeks. She was informed of the importance of frequent follow-up visits to maximize her success with intensive lifestyle modifications for her multiple health conditions.   Holly Krueger was informed we would discuss her lab results at her next visit unless there is a critical issue that needs to be addressed sooner. Holly Krueger agreed to keep her next visit at the agreed upon time to discuss these results.  Objective:   Blood pressure 110/71, pulse 73, temperature 97.8 F (36.6 C), height 5\' 3"  (1.6 m), weight 224 lb (101.6 kg), SpO2 97 %. Body mass index is 39.68 kg/m.  General: Cooperative, alert, well developed, in no acute  distress. HEENT: Conjunctivae and lids unremarkable. Cardiovascular: Regular rhythm.  Lungs: Normal work of breathing. Neurologic: No focal deficits.   Lab Results  Component Value Date   CREATININE 0.80 06/18/2020   BUN 9 06/18/2020   NA 142 06/18/2020   K 4.6 06/18/2020   CL 104 06/18/2020   CO2 23 06/18/2020   Lab Results  Component Value Date   ALT 19 06/18/2020   AST 18 06/18/2020   ALKPHOS 116 06/18/2020   BILITOT 0.4 06/18/2020   Lab Results  Component Value Date   HGBA1C 5.8 (H) 10/06/2020   HGBA1C 5.9 (H) 06/18/2020   Lab Results  Component Value Date   INSULIN 12.9 10/06/2020   INSULIN 15.7 06/18/2020   Lab Results  Component Value Date   TSH 2.700 06/18/2020   Lab Results  Component Value Date   CHOL 193 10/06/2020   HDL 30 (L) 10/06/2020   LDLCALC 133 (H) 10/06/2020    TRIG 168 (H) 10/06/2020   CHOLHDL 6.4 (H) 10/06/2020   Lab Results  Component Value Date   WBC 6.3 06/18/2020   HGB 11.5 06/18/2020   HCT 36.9 06/18/2020   MCV 82 06/18/2020   PLT 261 06/18/2020   Attestation Statements:   Reviewed by clinician on day of visit: allergies, medications, problem list, medical history, surgical history, family history, social history, and previous encounter notes.  Coral Ceo, am acting as Location manager for [employer], DO.  I have reviewed the above documentation for accuracy and completeness, and I agree with the above. Marjory Sneddon, D.O.  The [place] was signed into law in 2016 which includes the topic of electronic health records.  This provides immediate access to information in MyChart.  This includes consultation notes, operative notes, office notes, lab results and pathology reports.  If you have any questions about what you read please let us know at your next visit so we can discuss your concerns and take corrective action if need be.  We are right here with you.

## 2020-10-07 LAB — HEMOGLOBIN A1C
Est. average glucose Bld gHb Est-mCnc: 120 mg/dL
Hgb A1c MFr Bld: 5.8 % — ABNORMAL HIGH (ref 4.8–5.6)

## 2020-10-07 LAB — LIPID PANEL
Chol/HDL Ratio: 6.4 ratio — ABNORMAL HIGH (ref 0.0–4.4)
Cholesterol, Total: 193 mg/dL (ref 100–199)
HDL: 30 mg/dL — ABNORMAL LOW (ref >39)
LDL Chol Calc (NIH): 133 mg/dL — ABNORMAL HIGH (ref 0–99)
Triglycerides: 168 mg/dL — ABNORMAL HIGH (ref 0–149)
VLDL Cholesterol Cal: 30 mg/dL (ref 5–40)

## 2020-10-07 LAB — VITAMIN D 25 HYDROXY (VIT D DEFICIENCY, FRACTURES): Vit D, 25-Hydroxy: 21.8 ng/mL — ABNORMAL LOW (ref 30.0–100.0)

## 2020-10-07 LAB — INSULIN, RANDOM: INSULIN: 12.9 u[IU]/mL (ref 2.6–24.9)

## 2020-10-27 ENCOUNTER — Other Ambulatory Visit: Payer: Self-pay

## 2020-10-27 ENCOUNTER — Encounter (INDEPENDENT_AMBULATORY_CARE_PROVIDER_SITE_OTHER): Payer: Self-pay | Admitting: Family Medicine

## 2020-10-27 ENCOUNTER — Telehealth (INDEPENDENT_AMBULATORY_CARE_PROVIDER_SITE_OTHER): Payer: Self-pay

## 2020-10-27 ENCOUNTER — Telehealth (INDEPENDENT_AMBULATORY_CARE_PROVIDER_SITE_OTHER): Payer: 59 | Admitting: Family Medicine

## 2020-10-27 DIAGNOSIS — E559 Vitamin D deficiency, unspecified: Secondary | ICD-10-CM | POA: Diagnosis not present

## 2020-10-27 DIAGNOSIS — F3289 Other specified depressive episodes: Secondary | ICD-10-CM

## 2020-10-27 DIAGNOSIS — Z9189 Other specified personal risk factors, not elsewhere classified: Secondary | ICD-10-CM | POA: Diagnosis not present

## 2020-10-27 DIAGNOSIS — Z6839 Body mass index (BMI) 39.0-39.9, adult: Secondary | ICD-10-CM

## 2020-10-27 DIAGNOSIS — R7303 Prediabetes: Secondary | ICD-10-CM | POA: Diagnosis not present

## 2020-10-27 MED ORDER — VITAMIN D (ERGOCALCIFEROL) 1.25 MG (50000 UNIT) PO CAPS: 50000.0000 [IU] | ORAL_CAPSULE | ORAL | 0 refills | Status: DC

## 2020-10-27 MED ORDER — TOPIRAMATE 25 MG PO TABS: 25.0000 mg | ORAL_TABLET | Freq: Every day | ORAL | 0 refills | Status: DC

## 2020-10-27 NOTE — Telephone Encounter (Signed)
Verbal consent obtained to conduct video visit, via telehealth 

## 2020-10-28 NOTE — Progress Notes (Signed)
TeleHealth Visit:  Due to the COVID-19 pandemic, this visit was completed with telemedicine (audio/video) technology to reduce patient and provider exposure as well as to preserve personal protective equipment.   Holly Krueger has verbally consented to this TeleHealth visit. The patient is located at home, the provider is located at the [facility] office. The participants in this visit include the listed provider and patient. The visit was conducted today via MyChart video.   Chief Complaint: OBESITY Holly Krueger is here to discuss her progress with her obesity treatment plan along with follow-up of her obesity related diagnoses. Holly Krueger is on the Category 2 Plan and states she is following her eating plan approximately 90% of the time. Holly Krueger states she is doing 0 minutes 0 times per week.  Today's visit was #: 8 Starting weight: 244 lbs Starting date: 06/18/2020  Interim History: Holly Krueger has done well minimizing holiday weight gain. She is only up 2 lbs which is especially good since her exercise is low while recovering from foot surgery. She is happy with her Category 2 plan overall.  Subjective:   1. Vitamin D deficiency Holly Krueger's Vit D level is slowly improving, but is not yet at goal. I discussed labs with the patient today.  2. Pre-diabetes Holly Krueger's A1c and insulin have both improved with diet and weight loss. She is doing well with decreasing simple carbohydrates. I discussed labs with the patient today.  3. Other depression with emotional eating Holly Krueger is stable on Topamax, and she is doing well minimizing emotional eating behaviors.  4. At risk for activity intolerance Holly Krueger is at increased risk for impaired metabolic function due to decreased activity while recovering from surgery.  Assessment/Plan:   1. Vitamin D deficiency Low Vitamin D level contributes to fatigue and are associated with obesity, breast, and colon cancer. We will refill prescription Vitamin D for 1  month, and we will recheck labs in 3 months. Holly Krueger will follow-up for routine testing of Vitamin D, at least 2-3 times per year to avoid over-replacement.  - Vitamin D, Ergocalciferol, (DRISDOL) 1.25 MG (50000 UNIT) CAPS capsule; Take 1 capsule (50,000 Units total) by mouth every 7 (seven) days.  Dispense: 4 capsule; Refill: 0  2. Pre-diabetes Holly Krueger will continue to work on weight loss, diet, exercise, and decreasing simple carbohydrates to help decrease the risk of diabetes. We will recheck labs in 3 months.  3. Other depression with emotional eating Behavior modification techniques were discussed today to help Holly Krueger deal with her emotional/non-hunger eating behaviors. We will refill Topamax for 1 month. Orders and follow up as documented in patient record.   - topiramate (TOPAMAX) 25 MG tablet; Take 1 tablet (25 mg total) by mouth at bedtime.  Dispense: 30 tablet; Refill: 0  4. At risk for activity intolerance Holly Krueger was given approximately 15 minutes of impaired  metabolic function prevention counseling today. We discussed intensive lifestyle modifications today with an emphasis on specific nutrition and exercise instructions and strategies.   Repetitive spaced learning was employed today to elicit superior memory formation and behavioral change.  5. Class 2 severe obesity with serious comorbidity and body mass index (BMI) of 39.0 to 39.9 in adult, unspecified obesity type (HCC) Holly Krueger is currently in the action stage of change. As such, her goal is to continue with weight loss efforts. She has agreed to the Category 2 Plan.   Behavioral modification strategies: meal planning and cooking strategies.  Holly Krueger has agreed to follow-up with our clinic in 3  weeks. She was informed of the importance of frequent follow-up visits to maximize her success with intensive lifestyle modifications for her multiple health conditions.  Objective:   VITALS: Per patient if applicable, see  vitals. GENERAL: Alert and in no acute distress. CARDIOPULMONARY: No increased WOB. Speaking in clear sentences.  PSYCH: Pleasant and cooperative. Speech normal rate and rhythm. Affect is appropriate. Insight and judgement are appropriate. Attention is focused, linear, and appropriate.  NEURO: Oriented as arrived to appointment on time with no prompting.   Lab Results  Component Value Date   CREATININE 0.80 06/18/2020   BUN 9 06/18/2020   NA 142 06/18/2020   K 4.6 06/18/2020   CL 104 06/18/2020   CO2 23 06/18/2020   Lab Results  Component Value Date   ALT 19 06/18/2020   AST 18 06/18/2020   ALKPHOS 116 06/18/2020   BILITOT 0.4 06/18/2020   Lab Results  Component Value Date   HGBA1C 5.8 (H) 10/06/2020   HGBA1C 5.9 (H) 06/18/2020   Lab Results  Component Value Date   INSULIN 12.9 10/06/2020   INSULIN 15.7 06/18/2020   Lab Results  Component Value Date   TSH 2.700 06/18/2020   Lab Results  Component Value Date   CHOL 193 10/06/2020   HDL 30 (L) 10/06/2020   LDLCALC 133 (H) 10/06/2020   TRIG 168 (H) 10/06/2020   CHOLHDL 6.4 (H) 10/06/2020   Lab Results  Component Value Date   WBC 6.3 06/18/2020   HGB 11.5 06/18/2020   HCT 36.9 06/18/2020   MCV 82 06/18/2020   PLT 261 06/18/2020   No results found for: IRON, TIBC, FERRITIN  Attestation Statements:   Reviewed by clinician on day of visit: allergies, medications, problem list, medical history, surgical history, family history, social history, and previous encounter notes.   I, Trixie Dredge, am acting as transcriptionist for Dennard Nip, MD.  I have reviewed the above documentation for accuracy and completeness, and I agree with the above. - Dennard Nip, MD

## 2020-12-06 ENCOUNTER — Other Ambulatory Visit: Payer: Self-pay | Admitting: Student

## 2020-12-07 NOTE — Telephone Encounter (Signed)
This is a [place] pt.  °

## 2020-12-09 ENCOUNTER — Other Ambulatory Visit: Payer: Self-pay | Admitting: Physician Assistant

## 2020-12-09 ENCOUNTER — Other Ambulatory Visit (HOSPITAL_COMMUNITY): Payer: Self-pay | Admitting: Physician Assistant

## 2020-12-09 DIAGNOSIS — R1032 Left lower quadrant pain: Secondary | ICD-10-CM

## 2020-12-10 ENCOUNTER — Other Ambulatory Visit: Payer: Self-pay

## 2020-12-10 ENCOUNTER — Ambulatory Visit (HOSPITAL_COMMUNITY)
Admission: RE | Admit: 2020-12-10 | Discharge: 2020-12-10 | Disposition: A | Discharge status: Home / Self Care | Payer: 59 | Source: Ambulatory Visit | Attending: Physician Assistant | Admitting: Physician Assistant

## 2020-12-10 DIAGNOSIS — R1032 Left lower quadrant pain: Secondary | ICD-10-CM | POA: Diagnosis not present

## 2020-12-10 MED ORDER — IOHEXOL 300 MG/ML  SOLN
100.0000 mL | Freq: Once | INTRAMUSCULAR | Status: AC | PRN
  Administered 2020-12-10: 100 mL via INTRAVENOUS

## 2020-12-17 ENCOUNTER — Telehealth: Payer: Self-pay

## 2020-12-17 NOTE — Telephone Encounter (Signed)
Refill reques from [facility] for Promethazine 12.5 mg. /360, take 1-2 tabs po q 4-6 hrs as needed for nausea or vomiting. Pt was seen by Dr. Oneida Alar at the hospital and is requesting a refill.

## 2020-12-17 NOTE — Telephone Encounter (Signed)
I do not see that Dr. Oneida Alar has seen patient since having her colonoscopy in 2016. Phenergan was sent in back in May 2021 at the time of uncontrolled GERD based on telephone call. She needs an office visit in order to receive refills.  She should talk to her PCP if she needs the medication refilled at this time.

## 2020-12-18 NOTE — Telephone Encounter (Signed)
Left a detailed message for pt. Pt will need to discuss refills with PCP or schedule an apt to receive refills.

## 2020-12-31 ENCOUNTER — Ambulatory Visit (HOSPITAL_COMMUNITY): Payer: 59

## 2021-01-13 ENCOUNTER — Other Ambulatory Visit: Payer: Self-pay | Admitting: Student

## 2021-01-25 ENCOUNTER — Other Ambulatory Visit: Payer: Self-pay | Admitting: Cardiology

## 2021-06-10 NOTE — Progress Notes (Addendum)
Cardiology Office Note    Date:  06/11/2021   ID:  Kelton, Acevedo 1966/08/11, MRN [id number]  PCP:  Sharilyn Sites, MD  Cardiologist: Carlyle Dolly, MD    Chief Complaint  Patient presents with   Follow-up    Routine Visit    History of Present Illness:    Holly Krueger is a 55 y.o. female with past medical history of paroxysmal atrial fibrillation, HTN, prediabetes and GERD who presents to the office today for overdue follow-up.  She was last examined by myself in 10/2019 and had recently been diagnosed with COVID-19 and reported having dyspnea and a dry cough but oxygen saturations had remained stable.  She denied any recent chest pain or palpitations and her heart rate had been well controlled in the 80's to 90's when checked at home. She was continued on her current medication regimen including Toprol-XL 50 mg daily and Eliquis 5 mg twice daily for anticoagulation.  In talking with the patient today, she reports overall doing well since her last office visit. She has been less active since foot surgery earlier this year and has experienced an associated weight gain. She does consume several regular sodas daily and is trying to cut down on her intake. She experiences occasional palpitations but denies any persistent symptoms. No recent orthopnea, PND, lower extremity edema or exertional chest pain.  She has been without her Eliquis for quite some time and without Toprol-XL for a few weeks.  Was previously tolerating Eliquis well without any evidence of active bleeding.   Past Medical History:  Diagnosis Date   Atrial fibrillation ([place])    Endometriosis    GERD (gastroesophageal reflux disease)    Headache(784.0)    Hypertension    Kidney stone    PAF (paroxysmal atrial fibrillation) (HCC)    SOB (shortness of breath)    Swelling of both lower extremities     Past Surgical History:  Procedure Laterality Date   ABDOMINAL HYSTERECTOMY     APPENDECTOMY      COLONOSCOPY N/A 10/02/2015   Procedure: COLONOSCOPY;  Surgeon: Danie Binder, MD;  Location: [facility];  Service: Endoscopy;  Laterality: N/A;  830    FOOT SURGERY     GANGLION CYST EXCISION Right 09/23/2020   Procedure: EXCISION LIPOMA RIGHT FOOT;  Surgeon: Caprice Beaver, DPM;  Location: [facility] ORS;  Service: Podiatry;  Laterality: Right;   NASAL SINUS SURGERY  2014    Current Medications: Outpatient Medications Prior to Visit  Medication Sig Dispense Refill   famotidine (PEPCID) 20 MG tablet 1 PO BID PRN FOR UNCONTROLLED HEARTBURN (Patient taking differently: Take 20 mg by mouth 2 (two) times daily. 1 PO BID PRN FOR UNCONTROLLED HEARTBURN) 60 tablet 11   gabapentin (NEURONTIN) 300 MG capsule Take 300 mg by mouth at bedtime.      QUEtiapine (SEROQUEL) 50 MG tablet Take 50 mg by mouth at bedtime as needed (sleep).      zolpidem (AMBIEN) 10 MG tablet Take 1 tablet (10 mg total) by mouth at bedtime as needed for sleep. Up 5 times weekly 30 tablet 5   apixaban (ELIQUIS) 5 MG TABS tablet Take 5 mg by mouth 2 (two) times daily.     metoprolol succinate (TOPROL-XL) 50 MG 24 hr tablet Take 1 tablet (50 mg total) by mouth daily. OVERDUE FOLLOW UP APPOINTMENT NEEDED FOR FURTHER REFILLS. 2ND ATTEMPT 15 tablet 0   topiramate (TOPAMAX) 25 MG tablet Take 1 tablet (25 mg total)  by mouth at bedtime. (Patient not taking: Reported on 06/11/2021) 30 tablet 0   Vitamin D, Ergocalciferol, (DRISDOL) 1.25 MG (50000 UNIT) CAPS capsule Take 1 capsule (50,000 Units total) by mouth every 7 (seven) days. 4 capsule 0   No facility-administered medications prior to visit.     Allergies:   Patient has no known allergies.   Social History   Socioeconomic History   Marital status: Married    Spouse name: Barbaraann Rondo   Number of children: Not on file   Years of education: Not on file   Highest education level: Not on file  Occupational History   Occupation: phlebotomist   Occupation: endoscopy tech  Tobacco Use    Smoking status: Never   Smokeless tobacco: Never  Vaping Use   Vaping Use: Never used  Substance and Sexual Activity   Alcohol use: No   Drug use: No   Sexual activity: Yes  Other Topics Concern   Not on file  Social History Narrative   Not on file   Social Determinants of Health   Financial Resource Strain: Not on file  Food Insecurity: Not on file  Transportation Needs: Not on file  Physical Activity: Not on file  Stress: Not on file  Social Connections: Not on file     Family History:  The patient's family history includes Anxiety disorder in her mother; Diabetes in her father; Hyperlipidemia in her mother; Hypertension in her father and mother; Stroke in her father.   Review of Systems:    Please see the history of present illness.     All other systems reviewed and are otherwise negative except as noted above.   Physical Exam:    VS:  BP (!) 142/82   Pulse 82   Ht '5\' 4"'$  (1.626 m)   Wt 245 lb (111.1 kg)   SpO2 98%   BMI 42.05 kg/m    General: Well developed, well nourished,female appearing in no acute distress. Head: Normocephalic, atraumatic. Neck: No carotid bruits. JVD not elevated.  Lungs: Respirations regular and unlabored, without wheezes or rales.  Heart: Regular rate and rhythm. No S3 or S4.  No murmur, no rubs, or gallops appreciated. Abdomen: Appears non-distended. No obvious abdominal masses. Msk:  Strength and tone appear normal for age. No obvious joint deformities or effusions. Extremities: No clubbing or cyanosis. No pitting edema. Isolated edema along left ankle joint. Distal pedal pulses are 2+ bilaterally. Neuro: Alert and oriented X 3. Moves all extremities spontaneously. No focal deficits noted. Psych:  Responds to questions appropriately with a normal affect. Skin: No rashes or lesions noted  Wt Readings from Last 3 Encounters:  06/11/21 245 lb (111.1 kg)  10/06/20 224 lb (101.6 kg)  09/14/20 232 lb (105.2 kg)      Studies/Labs  Reviewed:   EKG:  EKG is ordered today.  The ekg ordered today demonstrates NSR, HR 83 with LAD. No diagnostic ST changes when compared to prior tracings.   Recent Labs: 06/18/2020: ALT 19; BUN 9; Creatinine, Ser 0.80; Hemoglobin 11.5; Platelets 261; Potassium 4.6; Sodium 142; TSH 2.700   Lipid Panel    Component Value Date/Time   CHOL 193 10/06/2020 0827   TRIG 168 (H) 10/06/2020 0827   HDL 30 (L) 10/06/2020 0827   CHOLHDL 6.4 (H) 10/06/2020 0827   LDLCALC 133 (H) 10/06/2020 0827    Additional studies/ records that were reviewed today include:   Echocardiogram: 04/2019 IMPRESSIONS     1. The left ventricle has normal  systolic function with an ejection  fraction of 60-65%. The cavity size was normal. There is focal basal  septal hypertrophy. Left ventricular diastolic parameters were normal.   2. The right ventricle has normal systolic function. The cavity was  normal. There is no increase in right ventricular wall thickness.   3. Left atrial size was mildly dilated.   4. No evidence of mitral valve stenosis.   5. The aortic valve is tricuspid. No stenosis of the aortic valve.   6. The aorta is normal in size and structure.   7. The aortic root is normal in size and structure.   8. Pulmonary hypertension is indeterminate, inadequate TR jet.   Assessment:    1. PAF (paroxysmal atrial fibrillation) ([place])   2. Medication management   3. Essential hypertension   4. Screening for diabetes mellitus (DM)      Plan:   In order of problems listed above:  1. Paroxysmal Atrial Fibrillation - She does experience occasional palpitations but denies any persistent symptoms resembling her prior atrial fibrillation. Will continue with Toprol-XL 50 mg daily for rate control and an updated Rx was provided. - She has not taken Eliquis in several months but reports overall tolerating this well at that time. I did recommend that she resume Eliquis 5 mg twice daily if tolerating well given  her continued intermittent palpitations. Will recheck CBC and BMET as she has not had recent labs.  2. HTN - Her BP is elevated at 142/82 during today's visit but she has been off Toprol-XL. Will plan to resume as outlined above.  3. Prediabetes - Her Hgb A1c was elevated to 5.8 in 09/2020. Will recheck with repeat labs along with repeat FLP.    Medication Adjustments/Labs and Tests Ordered: Current medicines are reviewed at length with the patient today.  Concerns regarding medicines are outlined above.  Medication changes, Labs and Tests ordered today are listed in the Patient Instructions below. Patient Instructions  Medication Instructions:  Your physician recommends that you continue on your current medications as directed. Please refer to the Current Medication list given to you today.  *If you need a refill on your cardiac medications before your next appointment, please call your pharmacy*   Lab Work: CBC,bmet,lipids, A1c  If you have labs (blood work) drawn today and your tests are completely normal, you will receive your results only by: [place] (if you have MyChart) OR A paper copy in the mail If you have any lab test that is abnormal or we need to change your treatment, we will call you to review the results.   Testing/Procedures: None today    Follow-Up: At [facility], you and your health needs are our priority.  As part of our continuing mission to provide you with exceptional heart care, we have created designated Provider Care Teams.  These Care Teams include your primary Cardiologist (physician) and Advanced Practice Providers (APPs -  Physician Assistants and Nurse Practitioners) who all work together to provide you with the care you need, when you need it.  We recommend signing up for the patient portal called "MyChart".  Sign up information is provided on this After Visit Summary.  MyChart is used to connect with patients for Virtual Visits  (Telemedicine).  Patients are able to view lab/test results, encounter notes, upcoming appointments, etc.  Non-urgent messages can be sent to your provider as well.   To learn more about what you can do with MyChart, go to [email or web address].  Your next appointment:   12 month(s)  The format for your next appointment:   In Person  Provider:   Bernerd Pho, PA-C   Other Instructions None    Signed, Erma Heritage, PA-C  06/11/2021 5:11 PM    [facility] [address] [place], [place] [address] Phone: [phone] Fax: [phone]

## 2021-06-11 ENCOUNTER — Ambulatory Visit: Payer: 59 | Admitting: Student

## 2021-06-11 ENCOUNTER — Encounter: Payer: Self-pay | Admitting: Student

## 2021-06-11 ENCOUNTER — Other Ambulatory Visit: Payer: Self-pay

## 2021-06-11 VITALS — BP 142/82 | HR 82 | Ht 64.0 in | Wt 245.0 lb

## 2021-06-11 DIAGNOSIS — I10 Essential (primary) hypertension: Secondary | ICD-10-CM | POA: Diagnosis not present

## 2021-06-11 DIAGNOSIS — Z131 Encounter for screening for diabetes mellitus: Secondary | ICD-10-CM

## 2021-06-11 DIAGNOSIS — I480 Paroxysmal atrial fibrillation: Secondary | ICD-10-CM | POA: Diagnosis not present

## 2021-06-11 DIAGNOSIS — Z79899 Other long term (current) drug therapy: Secondary | ICD-10-CM

## 2021-06-11 MED ORDER — METOPROLOL SUCCINATE ER 50 MG PO TB24: 50.0000 mg | ORAL_TABLET | Freq: Every day | ORAL | 3 refills | Status: DC

## 2021-06-11 MED ORDER — APIXABAN 5 MG PO TABS: 5.0000 mg | ORAL_TABLET | Freq: Two times a day (BID) | ORAL | 3 refills | Status: DC

## 2021-06-11 NOTE — Patient Instructions (Signed)
Medication Instructions:  Your physician recommends that you continue on your current medications as directed. Please refer to the Current Medication list given to you today.  *If you need a refill on your cardiac medications before your next appointment, please call your pharmacy*   Lab Work: CBC,bmet,lipids, A1c  If you have labs (blood work) drawn today and your tests are completely normal, you will receive your results only by: [place] (if you have MyChart) OR A paper copy in the mail If you have any lab test that is abnormal or we need to change your treatment, we will call you to review the results.   Testing/Procedures: None today    Follow-Up: At [facility] you and your health needs are our priority.  As part of our continuing mission to provide you with exceptional heart care, we have created designated Provider Care Teams.  These Care Teams include your primary Cardiologist (physician) and Advanced Practice Providers (APPs -  Physician Assistants and Nurse Practitioners) who all work together to provide you with the care you need, when you need it.  We recommend signing up for the patient portal called "MyChart".  Sign up information is provided on this After Visit Summary.  MyChart is used to connect with patients for Virtual Visits (Telemedicine).  Patients are able to view lab/test results, encounter notes, upcoming appointments, etc.  Non-urgent messages can be sent to your provider as well.   To learn more about what you can do with MyChart, go to [email or web address].    Your next appointment:   12 month(s)  The format for your next appointment:   In Person  Provider:   Bernerd Pho, PA-C   Other Instructions None

## 2021-06-12 IMAGING — CR DG CHEST 1V PORT
1 series · 1 of 1 positions shown · non-contrast
Comparison: April 24, 2019

CLINICAL DATA: Shortness of breath.  Atrial fibrillation

EXAM:
PORTABLE CHEST 1 VIEW

[portable]
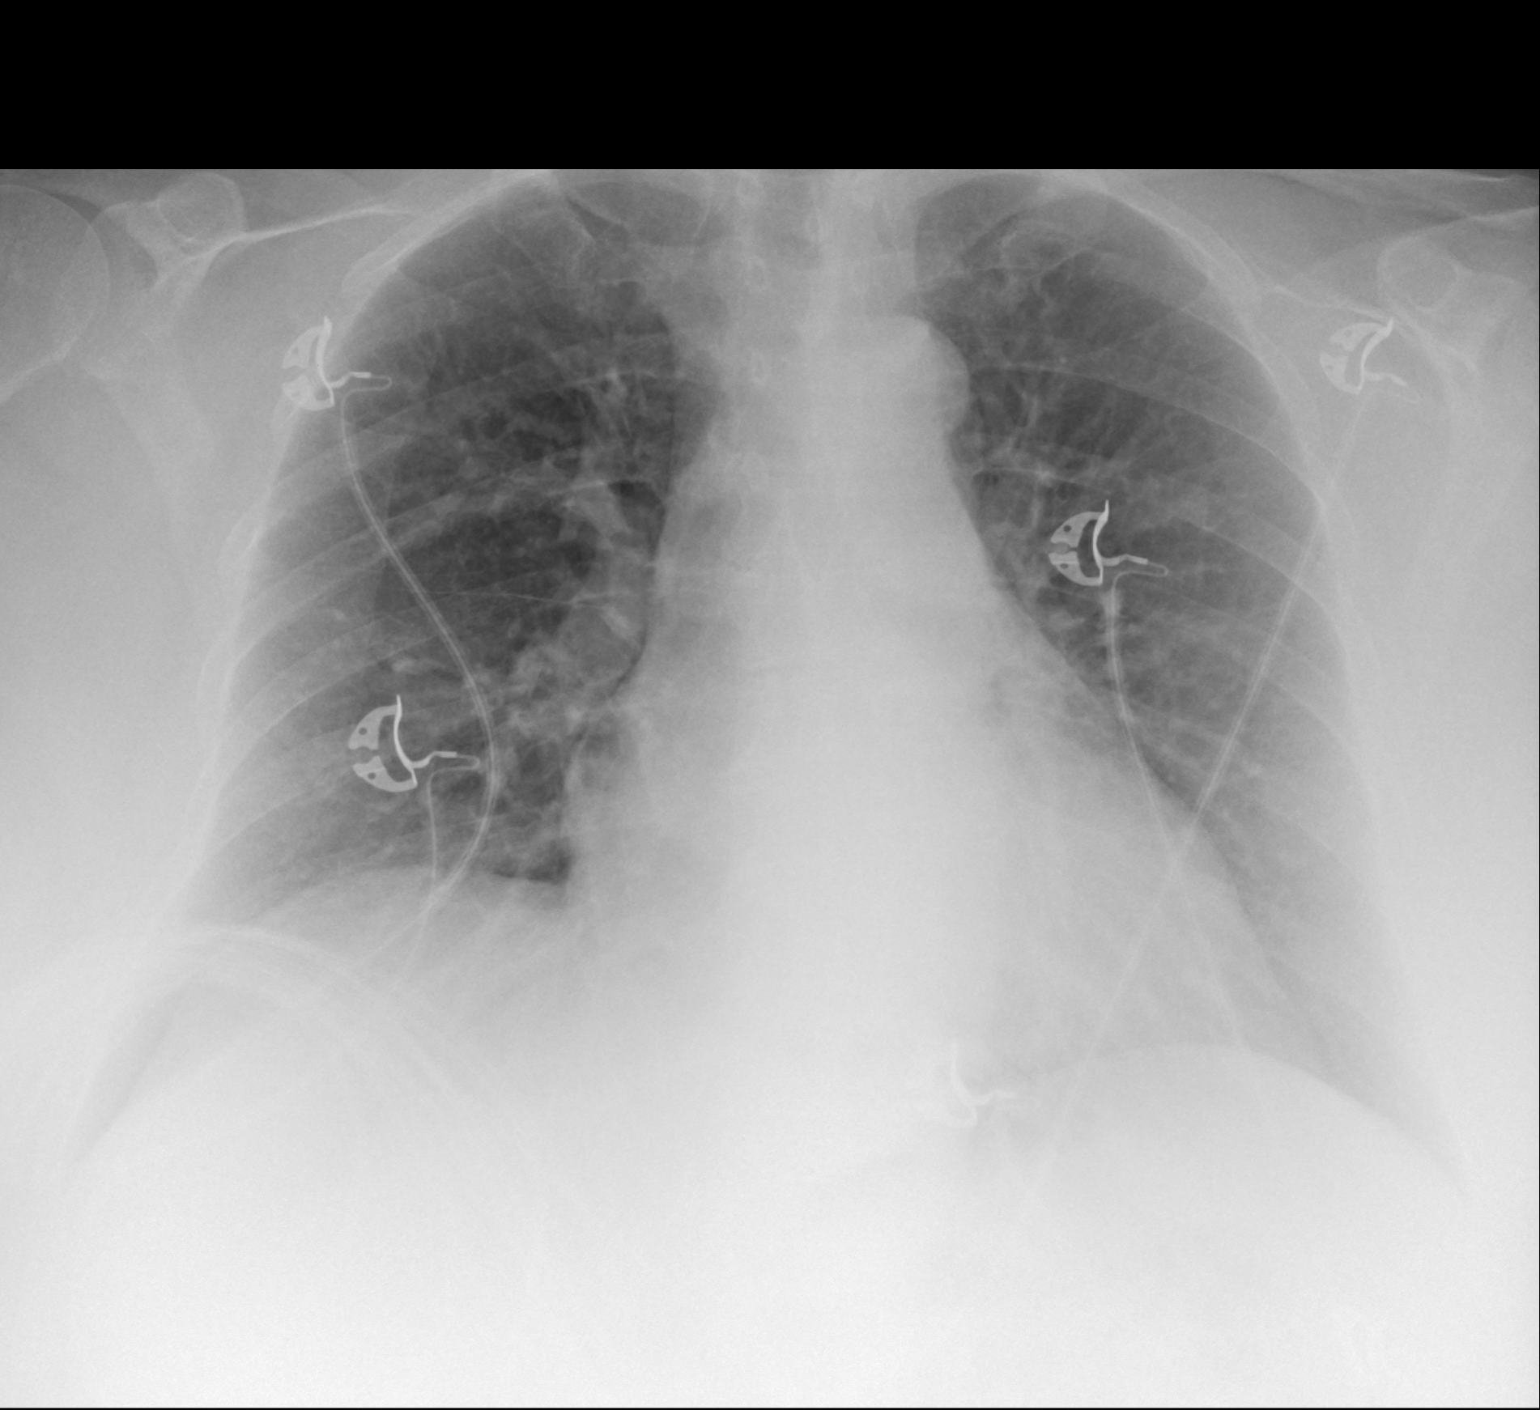

[1 of 1 positions shown; findings below may reference images not displayed]

FINDINGS: There is no edema or consolidation. Heart is borderline enlarged
with pulmonary vascularity normal. No adenopathy. No bone lesions.
IMPRESSION: Borderline cardiomegaly.  No edema or consolidation.

## 2021-07-07 ENCOUNTER — Other Ambulatory Visit: Payer: Self-pay

## 2021-07-07 ENCOUNTER — Other Ambulatory Visit (HOSPITAL_COMMUNITY)
Admission: RE | Admit: 2021-07-07 | Discharge: 2021-07-07 | Disposition: A | Discharge status: Home / Self Care | Payer: 59 | Source: Ambulatory Visit | Attending: Physician Assistant | Admitting: Physician Assistant

## 2021-07-07 DIAGNOSIS — Z20822 Contact with and (suspected) exposure to covid-19: Secondary | ICD-10-CM | POA: Insufficient documentation

## 2021-07-07 DIAGNOSIS — Z01812 Encounter for preprocedural laboratory examination: Secondary | ICD-10-CM | POA: Insufficient documentation

## 2021-07-07 LAB — SARS CORONAVIRUS 2 (TAT 6-24 HRS): SARS Coronavirus 2: NEGATIVE

## 2021-08-02 ENCOUNTER — Other Ambulatory Visit (INDEPENDENT_AMBULATORY_CARE_PROVIDER_SITE_OTHER): Payer: Self-pay | Admitting: Family Medicine

## 2021-08-02 DIAGNOSIS — E559 Vitamin D deficiency, unspecified: Secondary | ICD-10-CM

## 2021-09-06 ENCOUNTER — Other Ambulatory Visit (HOSPITAL_COMMUNITY): Payer: Self-pay | Admitting: Physician Assistant

## 2021-09-06 ENCOUNTER — Other Ambulatory Visit: Payer: Self-pay

## 2021-09-06 ENCOUNTER — Ambulatory Visit (HOSPITAL_COMMUNITY)
Admission: RE | Admit: 2021-09-06 | Discharge: 2021-09-06 | Disposition: A | Discharge status: Home / Self Care | Payer: 59 | Source: Ambulatory Visit | Attending: Physician Assistant | Admitting: Physician Assistant

## 2021-09-06 DIAGNOSIS — R053 Chronic cough: Secondary | ICD-10-CM

## 2021-09-14 ENCOUNTER — Encounter: Payer: Self-pay | Admitting: Cardiology

## 2021-09-21 ENCOUNTER — Other Ambulatory Visit: Payer: Self-pay

## 2021-09-21 ENCOUNTER — Encounter: Payer: Self-pay | Admitting: Pulmonary Disease

## 2021-09-21 ENCOUNTER — Ambulatory Visit: Payer: 59 | Admitting: Pulmonary Disease

## 2021-09-21 VITALS — BP 126/84 | HR 74 | Ht 64.0 in | Wt 247.2 lb

## 2021-09-21 DIAGNOSIS — R053 Chronic cough: Secondary | ICD-10-CM

## 2021-09-21 DIAGNOSIS — J4530 Mild persistent asthma, uncomplicated: Secondary | ICD-10-CM

## 2021-09-21 MED ORDER — FEXOFENADINE HCL 180 MG PO TABS: 180.0000 mg | ORAL_TABLET | Freq: Every day | ORAL | 3 refills | Status: DC

## 2021-09-21 MED ORDER — ADVAIR HFA 230-21 MCG/ACT IN AERO: 2.0000 | INHALATION_SPRAY | Freq: Two times a day (BID) | RESPIRATORY_TRACT | 12 refills | Status: DC

## 2021-09-21 MED ORDER — FLUTICASONE PROPIONATE 50 MCG/ACT NA SUSP: 2.0000 | Freq: Every day | NASAL | 2 refills | Status: DC

## 2021-09-21 NOTE — Patient Instructions (Addendum)
Start fluticasone nasal spray, 2 sprays per nostril daily  Start fexofenadine 180mg  tablet daily   Start advair HFA inhaler 230-27mcg 2 puffs twice daily - rinse mouth out after each use  We will schedule you for follow up in 4-6 months for pulmonary function tests

## 2021-09-21 NOTE — Progress Notes (Signed)
Synopsis: Referred in December 2022 for cough by Sharilyn Sites, MD  Subjective:   PATIENT ID: Holly Krueger GENDER: female DOB: 02-25-1966, MRN: [id number]  HPI  Chief Complaint  Patient presents with   Consult    Referred by PCP for chronic cough. Non productive cough. Has noticed an increase in wheezing.    Holly Krueger is a 55 year old woman, never smoker with GERD, hypertension, and atrial fibrillation who was referred to pulmonary clinic for chronic cough.   She reports developing cough 3 months ago and was started on an antibiotic along with prednisone at the end of August with no improvement.  She was then placed on a second antibiotic and most recently a 2-week steroid taper with no improvement in her symptoms.  She complains of dry cough.  She does have hoarseness of her voice.  She denies any sinus congestion or postnasal drainage.  She did have sinus surgery in the last year or 2 due to frequent sinus infections and congestion.  She reports history of nodules on her vocal cords which have been removed.  She reports significant history for reflux disease and is currently taking 40 mg of omeprazole daily.  She reports it was prescribed to be taken twice daily.  She reports her reflux symptoms are well controlled at this time on medication.  She is sleeping upright in a recliner.  She does have nighttime awakenings due to the cough.  She denies any issues with seasonal allergies.  She is a never smoker.  She denies any significant secondhand smoke exposure.  She currently works at [employer] in [place].  Past Medical History:  Diagnosis Date   Atrial fibrillation (HCC)    Endometriosis    GERD (gastroesophageal reflux disease)    Headache(784.0)    Hypertension    Kidney stone    PAF (paroxysmal atrial fibrillation) (HCC)    SOB (shortness of breath)    Swelling of both lower extremities      Family History  Problem Relation Age of Onset   Hyperlipidemia Mother     Hypertension Mother    Anxiety disorder Mother    Hypertension Father    Diabetes Father    Stroke Father      Social History   Socioeconomic History   Marital status: Married    Spouse name: Barbaraann Rondo   Number of children: Not on file   Years of education: Not on file   Highest education level: Not on file  Occupational History   Occupation: phlebotomist   Occupation: endoscopy tech  Tobacco Use   Smoking status: Never   Smokeless tobacco: Never  Vaping Use   Vaping Use: Never used  Substance and Sexual Activity   Alcohol use: No   Drug use: No   Sexual activity: Yes  Other Topics Concern   Not on file  Social History Narrative   Not on file   Social Determinants of Health   Financial Resource Strain: Not on file  Food Insecurity: Not on file  Transportation Needs: Not on file  Physical Activity: Not on file  Stress: Not on file  Social Connections: Not on file  Intimate Partner Violence: Not on file     No Known Allergies   Outpatient Medications Prior to Visit  Medication Sig Dispense Refill   apixaban (ELIQUIS) 5 MG TABS tablet Take 1 tablet (5 mg total) by mouth 2 (two) times daily. 180 tablet 3   gabapentin (NEURONTIN) 300 MG  capsule Take 300 mg by mouth at bedtime.      metoprolol succinate (TOPROL-XL) 50 MG 24 hr tablet Take 1 tablet (50 mg total) by mouth daily. 90 tablet 3   omeprazole (PRILOSEC) 40 MG capsule Take 40 mg by mouth 2 (two) times daily.     QUEtiapine (SEROQUEL) 50 MG tablet Take 50 mg by mouth at bedtime as needed (sleep).      RYBELSUS 7 MG TABS Take 1 tablet by mouth every morning.     zolpidem (AMBIEN) 10 MG tablet Take 1 tablet (10 mg total) by mouth at bedtime as needed for sleep. Up 5 times weekly 30 tablet 5   famotidine (PEPCID) 20 MG tablet 1 PO BID PRN FOR UNCONTROLLED HEARTBURN (Patient not taking: Reported on 09/21/2021) 60 tablet 11   topiramate (TOPAMAX) 25 MG tablet Take 1 tablet (25 mg total) by mouth at bedtime. (Patient  not taking: Reported on 06/11/2021) 30 tablet 0   Vitamin D, Ergocalciferol, (DRISDOL) 1.25 MG (50000 UNIT) CAPS capsule Take 1 capsule (50,000 Units total) by mouth every 7 (seven) days. 4 capsule 0   No facility-administered medications prior to visit.   Review of Systems  Constitutional:  Negative for chills, fever, malaise/fatigue and weight loss.  HENT:  Negative for congestion, sinus pain and sore throat.   Eyes: Negative.   Respiratory:  Positive for cough and shortness of breath. Negative for hemoptysis, sputum production and wheezing.   Cardiovascular:  Negative for chest pain, palpitations, orthopnea, claudication and leg swelling.  Gastrointestinal:  Negative for abdominal pain, heartburn, nausea and vomiting.  Genitourinary: Negative.   Musculoskeletal:  Negative for joint pain and myalgias.  Skin:  Negative for rash.  Neurological:  Negative for weakness.  Endo/Heme/Allergies: Negative.   Psychiatric/Behavioral: Negative.     Objective:   Vitals:   09/21/21 1553  BP: 126/84  Pulse: 74  SpO2: 99%  Weight: 247 lb 3.2 oz (112.1 kg)  Height: 5\' 4"  (1.626 m)   Physical Exam Constitutional:      General: She is not in acute distress.    Appearance: She is obese. She is not ill-appearing.  HENT:     Head: Normocephalic and atraumatic.     Mouth/Throat:     Mouth: Mucous membranes are moist.     Pharynx: No oropharyngeal exudate or posterior oropharyngeal erythema.  Eyes:     General: No scleral icterus.    Conjunctiva/sclera: Conjunctivae normal.     Pupils: Pupils are equal, round, and reactive to light.  Cardiovascular:     Rate and Rhythm: Normal rate and regular rhythm.     Pulses: Normal pulses.     Heart sounds: Normal heart sounds. No murmur heard. Pulmonary:     Effort: Pulmonary effort is normal.     Breath sounds: Normal breath sounds. No wheezing, rhonchi or rales.  Abdominal:     General: Bowel sounds are normal.     Palpations: Abdomen is soft.   Musculoskeletal:     Right lower leg: No edema.     Left lower leg: No edema.  Lymphadenopathy:     Cervical: No cervical adenopathy.  Skin:    General: Skin is warm and dry.  Neurological:     General: No focal deficit present.     Mental Status: She is alert.  Psychiatric:        Mood and Affect: Mood normal.        Behavior: Behavior normal.  Thought Content: Thought content normal.        Judgment: Judgment normal.   CBC    Component Value Date/Time   WBC 6.3 06/18/2020 1155   WBC 6.0 09/20/2019 1435   RBC 4.50 06/18/2020 1155   RBC 3.43 (L) 09/20/2019 1435   HGB 11.5 06/18/2020 1155   HCT 36.9 06/18/2020 1155   PLT 261 06/18/2020 1155   MCV 82 06/18/2020 1155   MCH 25.6 (L) 06/18/2020 1155   MCH 28.6 09/20/2019 1435   MCHC 31.2 (L) 06/18/2020 1155   MCHC 30.8 09/20/2019 1435   RDW 15.5 (H) 06/18/2020 1155   LYMPHSABS 1.3 06/18/2020 1155   MONOABS 0.4 09/20/2019 1435   EOSABS 0.2 06/18/2020 1155   BASOSABS 0.1 06/18/2020 1155   BMP Latest Ref Rng & Units 06/18/2020 09/20/2019 07/05/2019  Glucose 65 - 99 mg/dL 97 99 163(H)  BUN 6 - 24 mg/dL 9 9 13   Creatinine 0.57 - 1.00 mg/dL 0.80 0.98 0.93  BUN/Creat Ratio 9 - 23 11 - -  Sodium 134 - 144 mmol/L 142 140 137  Potassium 3.5 - 5.2 mmol/L 4.6 3.9 3.1(L)  Chloride 96 - 106 mmol/L 104 110 108  CO2 20 - 29 mmol/L 23 23 14(L)  Calcium 8.7 - 10.2 mg/dL 9.1 8.6(L) 8.2(L)   Chest imaging: CXR 09/06/21 Stable cardiomediastinal silhouette with normal heart size. No pneumothorax. No pleural effusion. Lungs appear clear, with no acute consolidative airspace disease and no pulmonary edema. Stable small eventration of the anterior right hemidiaphragm.  PFT: No flowsheet data found.  Labs:  Path:  Echo 05/10/19: LVEF 60-65%. Focal Basal hypertrophy. LV diastolic parameters normal. LA mildly dilated. RV systolic function is normal.   Heart Catheterization:  Assessment & Plan:   Chronic cough - Plan:  fluticasone (FLONASE) 50 MCG/ACT nasal spray, fexofenadine (ALLEGRA) 180 MG tablet  Mild persistent reactive airway disease without complication - Plan: fluticasone-salmeterol (ADVAIR HFA) 230-21 MCG/ACT inhaler, Pulmonary Function Test  Discussion: Krishana Lutze is a 55 year old woman, never smoker with GERD, hypertension, and atrial fibrillation who was referred to pulmonary clinic for chronic cough.   Differential includes reactive airways disease vs Allergies vs GERD vs tracheobronchomalacia vs vocal cord issues leading to her chronic cough.  She has been treated with multiple rounds of oral prednisone and antibiotics without much improvement in her cough.  We will treat her with Advair HFA 230-21 MCG 2 puffs twice daily.  She is to start using fluticasone nasal spray 2 sprays per nostril daily.  She is to start fexofenadine 180 mg daily.  If cough is not improved by next visit we will check HRCT Chest to evaluate for tracheobronchomalacia.   Follow up in 6 weeks for pulmonary function tests.  Freda Jackson, MD [facility] Office: [phone]   Current Outpatient Medications:    apixaban (ELIQUIS) 5 MG TABS tablet, Take 1 tablet (5 mg total) by mouth 2 (two) times daily., Disp: 180 tablet, Rfl: 3   fexofenadine (ALLEGRA) 180 MG tablet, Take 1 tablet (180 mg total) by mouth daily., Disp: 30 tablet, Rfl: 3   fluticasone (FLONASE) 50 MCG/ACT nasal spray, Place 2 sprays into both nostrils daily., Disp: 11.1 mL, Rfl: 2   fluticasone-salmeterol (ADVAIR HFA) 230-21 MCG/ACT inhaler, Inhale 2 puffs into the lungs 2 (two) times daily., Disp: 1 each, Rfl: 12   gabapentin (NEURONTIN) 300 MG capsule, Take 300 mg by mouth at bedtime. , Disp: , Rfl:    metoprolol succinate (TOPROL-XL)  50 MG 24 hr tablet, Take 1 tablet (50 mg total) by mouth daily., Disp: 90 tablet, Rfl: 3   omeprazole (PRILOSEC) 40 MG capsule, Take 40 mg by mouth 2 (two) times daily., Disp: , Rfl:     QUEtiapine (SEROQUEL) 50 MG tablet, Take 50 mg by mouth at bedtime as needed (sleep). , Disp: , Rfl:    RYBELSUS 7 MG TABS, Take 1 tablet by mouth every morning., Disp: , Rfl:    zolpidem (AMBIEN) 10 MG tablet, Take 1 tablet (10 mg total) by mouth at bedtime as needed for sleep. Up 5 times weekly, Disp: 30 tablet, Rfl: 5   famotidine (PEPCID) 20 MG tablet, 1 PO BID PRN FOR UNCONTROLLED HEARTBURN (Patient not taking: Reported on 09/21/2021), Disp: 60 tablet, Rfl: 11

## 2021-09-27 ENCOUNTER — Telehealth: Payer: Self-pay | Admitting: Pulmonary Disease

## 2021-09-27 NOTE — Telephone Encounter (Signed)
Received FMLA forms via fax from [employer].  Called patient and she stated the start date should be 09/21/21 (her first appt with Dr. Erin Fulling) and that she continues to work but needs time off for appointments.  She understands there will be a $29 fee and she is okay with it being billed to her account.  I told her we will mail her a copy of the signed form.

## 2021-09-27 NOTE — Telephone Encounter (Signed)
disregard

## 2021-09-27 NOTE — Telephone Encounter (Signed)
Emailed form to [employer] for completion.

## 2021-09-28 ENCOUNTER — Ambulatory Visit: Payer: 59 | Admitting: Pulmonary Disease

## 2021-10-05 NOTE — Telephone Encounter (Signed)
Patient called for paperwork. Stated to patient Dr. Erin Fulling signing paperwork today.

## 2021-10-06 DIAGNOSIS — Z0289 Encounter for other administrative examinations: Secondary | ICD-10-CM

## 2021-10-06 NOTE — Telephone Encounter (Signed)
Signed FMLA form was faxed to [employer] fax# [phone]

## 2021-11-10 ENCOUNTER — Ambulatory Visit: Payer: 59 | Admitting: Pulmonary Disease

## 2021-11-12 ENCOUNTER — Encounter: Payer: Self-pay | Admitting: Pulmonary Disease

## 2021-11-12 ENCOUNTER — Other Ambulatory Visit: Payer: Self-pay

## 2021-11-12 ENCOUNTER — Ambulatory Visit (INDEPENDENT_AMBULATORY_CARE_PROVIDER_SITE_OTHER): Payer: 59 | Admitting: Pulmonary Disease

## 2021-11-12 ENCOUNTER — Ambulatory Visit: Payer: 59 | Admitting: Pulmonary Disease

## 2021-11-12 VITALS — BP 124/82 | HR 60 | Ht 64.0 in | Wt 242.6 lb

## 2021-11-12 DIAGNOSIS — J4540 Moderate persistent asthma, uncomplicated: Secondary | ICD-10-CM

## 2021-11-12 DIAGNOSIS — R053 Chronic cough: Secondary | ICD-10-CM | POA: Diagnosis not present

## 2021-11-12 DIAGNOSIS — J4530 Mild persistent asthma, uncomplicated: Secondary | ICD-10-CM

## 2021-11-12 DIAGNOSIS — R942 Abnormal results of pulmonary function studies: Secondary | ICD-10-CM | POA: Diagnosis not present

## 2021-11-12 LAB — PULMONARY FUNCTION TEST
DL/VA % pred: 79 %
DL/VA: 3.36 ml/min/mmHg/L
DLCO cor % pred: 63 %
DLCO cor: 13.06 ml/min/mmHg
DLCO unc % pred: 63 %
DLCO unc: 13.06 ml/min/mmHg
FEF 25-75 Post: 3.43 L/sec
FEF 25-75 Pre: 2.38 L/sec
FEF2575-%Change-Post: 44 %
FEF2575-%Pred-Post: 138 %
FEF2575-%Pred-Pre: 95 %
FEV1-%Change-Post: 9 %
FEV1-%Pred-Post: 84 %
FEV1-%Pred-Pre: 77 %
FEV1-Post: 2.24 L
FEV1-Pre: 2.05 L
FEV1FVC-%Change-Post: 5 %
FEV1FVC-%Pred-Pre: 105 %
FEV6-%Change-Post: 5 %
FEV6-%Pred-Post: 78 %
FEV6-%Pred-Pre: 74 %
FEV6-Post: 2.56 L
FEV6-Pre: 2.43 L
FEV6FVC-%Change-Post: 1 %
FEV6FVC-%Pred-Post: 103 %
FEV6FVC-%Pred-Pre: 102 %
FVC-%Change-Post: 3 %
FVC-%Pred-Post: 75 %
FVC-%Pred-Pre: 72 %
FVC-Post: 2.56 L
FVC-Pre: 2.46 L
Post FEV1/FVC ratio: 87 %
Post FEV6/FVC ratio: 100 %
Pre FEV1/FVC ratio: 83 %
Pre FEV6/FVC Ratio: 99 %
RV % pred: 58 %
RV: 1.13 L
TLC % pred: 73 %
TLC: 3.69 L

## 2021-11-12 NOTE — Progress Notes (Signed)
PFT done today. 

## 2021-11-12 NOTE — Progress Notes (Signed)
Synopsis: Referred in December 2022 for cough by Sharilyn Sites, MD  Subjective:   PATIENT ID: Holly Krueger GENDER: female DOB: 06-27-1966, MRN: [id number]  HPI  Chief Complaint  Patient presents with   Follow-up    F/U after PFT   Holly Krueger is a 56 year old woman, never smoker with GERD, hypertension, and atrial fibrillation who returns to pulmonary clinic for chronic cough.   She reports improvement in the cough with starting fexofenadine daily, fluticasone nasal spray daily and advair 230-91mcg 2 puffs daily (not twice). She recently stopped flonase 1 week ago since she has been feeling better.   She denies any issues with joint pains, skin rashes or joint stiffness. She has intermittent muscle cramps/spasms of the lower extremities. No autoimmune history in the family. She reports history of Behcets Disease.   OV 09/21/21 She reports developing cough 3 months ago and was started on an antibiotic along with prednisone at the end of August with no improvement.  She was then placed on a second antibiotic and most recently a 2-week steroid taper with no improvement in her symptoms.  She complains of dry cough.  She does have hoarseness of her voice.  She denies any sinus congestion or postnasal drainage.  She did have sinus surgery in the last year or 2 due to frequent sinus infections and congestion.  She reports history of nodules on her vocal cords which have been removed.  She reports significant history for reflux disease and is currently taking 40 mg of omeprazole daily.  She reports it was prescribed to be taken twice daily.  She reports her reflux symptoms are well controlled at this time on medication.  She is sleeping upright in a recliner.  She does have nighttime awakenings due to the cough.  She denies any issues with seasonal allergies.  She is a never smoker.  She denies any significant secondhand smoke exposure.  She currently works at [employer] in  [place].  Past Medical History:  Diagnosis Date   Atrial fibrillation (HCC)    Endometriosis    GERD (gastroesophageal reflux disease)    Headache(784.0)    Hypertension    Kidney stone    PAF (paroxysmal atrial fibrillation) (HCC)    SOB (shortness of breath)    Swelling of both lower extremities      Family History  Problem Relation Age of Onset   Hyperlipidemia Mother    Hypertension Mother    Anxiety disorder Mother    Hypertension Father    Diabetes Father    Stroke Father      Social History   Socioeconomic History   Marital status: Married    Spouse name: Barbaraann Rondo   Number of children: Not on file   Years of education: Not on file   Highest education level: Not on file  Occupational History   Occupation: phlebotomist   Occupation: endoscopy tech  Tobacco Use   Smoking status: Never   Smokeless tobacco: Never  Vaping Use   Vaping Use: Never used  Substance and Sexual Activity   Alcohol use: No   Drug use: No   Sexual activity: Yes  Other Topics Concern   Not on file  Social History Narrative   Not on file   Social Determinants of Health   Financial Resource Strain: Not on file  Food Insecurity: Not on file  Transportation Needs: Not on file  Physical Activity: Not on file  Stress: Not on file  Social Connections: Not on file  Intimate Partner Violence: Not on file     No Known Allergies   Outpatient Medications Prior to Visit  Medication Sig Dispense Refill   apixaban (ELIQUIS) 5 MG TABS tablet Take 1 tablet (5 mg total) by mouth 2 (two) times daily. 180 tablet 3   fexofenadine (ALLEGRA) 180 MG tablet Take 1 tablet (180 mg total) by mouth daily. 30 tablet 3   fluticasone (FLONASE) 50 MCG/ACT nasal spray Place 2 sprays into both nostrils daily. 11.1 mL 2   fluticasone-salmeterol (ADVAIR HFA) 230-21 MCG/ACT inhaler Inhale 2 puffs into the lungs 2 (two) times daily. 1 each 12   gabapentin (NEURONTIN) 300 MG capsule Take 300 mg by mouth at  bedtime.      metoprolol succinate (TOPROL-XL) 50 MG 24 hr tablet Take 1 tablet (50 mg total) by mouth daily. 90 tablet 3   omeprazole (PRILOSEC) 40 MG capsule Take 40 mg by mouth 2 (two) times daily.     QUEtiapine (SEROQUEL) 50 MG tablet Take 50 mg by mouth at bedtime as needed (sleep).      RYBELSUS 7 MG TABS Take 1 tablet by mouth every morning.     zolpidem (AMBIEN) 10 MG tablet Take 1 tablet (10 mg total) by mouth at bedtime as needed for sleep. Up 5 times weekly 30 tablet 5   famotidine (PEPCID) 20 MG tablet 1 PO BID PRN FOR UNCONTROLLED HEARTBURN (Patient not taking: Reported on 09/21/2021) 60 tablet 11   No facility-administered medications prior to visit.   Review of Systems  Constitutional:  Negative for chills, fever, malaise/fatigue and weight loss.  HENT:  Negative for congestion, sinus pain and sore throat.   Eyes: Negative.   Respiratory:  Positive for cough and shortness of breath. Negative for hemoptysis, sputum production and wheezing.   Cardiovascular:  Negative for chest pain, palpitations, orthopnea, claudication and leg swelling.  Gastrointestinal:  Negative for abdominal pain, heartburn, nausea and vomiting.  Genitourinary: Negative.   Musculoskeletal:  Negative for joint pain and myalgias.  Skin:  Negative for rash.  Neurological:  Negative for weakness.  Endo/Heme/Allergies: Negative.   Psychiatric/Behavioral: Negative.     Objective:   Vitals:   11/12/21 1612  BP: 124/82  Pulse: 60  SpO2: 97%  Weight: 242 lb 9.6 oz (110 kg)  Height: 5\' 4"  (1.626 m)   Physical Exam Constitutional:      General: She is not in acute distress.    Appearance: She is obese. She is not ill-appearing.  HENT:     Head: Normocephalic and atraumatic.     Mouth/Throat:     Mouth: Mucous membranes are moist.     Pharynx: No oropharyngeal exudate or posterior oropharyngeal erythema.  Eyes:     General: No scleral icterus.    Conjunctiva/sclera: Conjunctivae normal.      Pupils: Pupils are equal, round, and reactive to light.  Cardiovascular:     Rate and Rhythm: Normal rate and regular rhythm.     Pulses: Normal pulses.     Heart sounds: Normal heart sounds. No murmur heard. Pulmonary:     Effort: Pulmonary effort is normal.     Breath sounds: Normal breath sounds. No wheezing, rhonchi or rales.  Abdominal:     General: Bowel sounds are normal.     Palpations: Abdomen is soft.  Musculoskeletal:     Right lower leg: No edema.     Left lower leg: No edema.  Lymphadenopathy:  Cervical: No cervical adenopathy.  Skin:    General: Skin is warm and dry.  Neurological:     General: No focal deficit present.     Mental Status: She is alert.  Psychiatric:        Mood and Affect: Mood normal.        Behavior: Behavior normal.        Thought Content: Thought content normal.        Judgment: Judgment normal.   CBC    Component Value Date/Time   WBC 6.3 06/18/2020 1155   WBC 6.0 09/20/2019 1435   RBC 4.50 06/18/2020 1155   RBC 3.43 (L) 09/20/2019 1435   HGB 11.5 06/18/2020 1155   HCT 36.9 06/18/2020 1155   PLT 261 06/18/2020 1155   MCV 82 06/18/2020 1155   MCH 25.6 (L) 06/18/2020 1155   MCH 28.6 09/20/2019 1435   MCHC 31.2 (L) 06/18/2020 1155   MCHC 30.8 09/20/2019 1435   RDW 15.5 (H) 06/18/2020 1155   LYMPHSABS 1.3 06/18/2020 1155   MONOABS 0.4 09/20/2019 1435   EOSABS 0.2 06/18/2020 1155   BASOSABS 0.1 06/18/2020 1155   BMP Latest Ref Rng & Units 06/18/2020 09/20/2019 07/05/2019  Glucose 65 - 99 mg/dL 97 99 163(H)  BUN 6 - 24 mg/dL 9 9 13   Creatinine 0.57 - 1.00 mg/dL 0.80 0.98 0.93  BUN/Creat Ratio 9 - 23 11 - -  Sodium 134 - 144 mmol/L 142 140 137  Potassium 3.5 - 5.2 mmol/L 4.6 3.9 3.1(L)  Chloride 96 - 106 mmol/L 104 110 108  CO2 20 - 29 mmol/L 23 23 14(L)  Calcium 8.7 - 10.2 mg/dL 9.1 8.6(L) 8.2(L)   Chest imaging: CXR 09/06/21 Stable cardiomediastinal silhouette with normal heart size. No pneumothorax. No pleural effusion.  Lungs appear clear, with no acute consolidative airspace disease and no pulmonary edema. Stable small eventration of the anterior right hemidiaphragm.  PFT: PFT Results Latest Ref Rng & Units 11/12/2021  FVC-Pre L 2.46  FVC-Predicted Pre % 72  FVC-Post L 2.56  FVC-Predicted Post % 75  Pre FEV1/FVC % % 83  Post FEV1/FCV % % 87  FEV1-Pre L 2.05  FEV1-Predicted Pre % 77  FEV1-Post L 2.24  DLCO uncorrected ml/min/mmHg 13.06  DLCO UNC% % 63  DLCO corrected ml/min/mmHg 13.06  DLCO COR %Predicted % 63  DLVA Predicted % 79  TLC L 3.69  TLC % Predicted % 73  RV % Predicted % 58  2023: Mild restriction, mild diffusion defect  Labs:  Path:  Echo 05/10/19: LVEF 60-65%. Focal Basal hypertrophy. LV diastolic parameters normal. LA mildly dilated. RV systolic function is normal.   Heart Catheterization:  Assessment & Plan:   Chronic cough - Plan: CT Chest High Resolution  Moderate persistent asthma without complication  Abnormal PFT - Plan: CT Chest High Resolution  Discussion: Holly Krueger is a 56 year old woman, never smoker with GERD, hypertension, and atrial fibrillation who returns to pulmonary clinic for chronic cough.    Her cough has improved with ICS/LABA inhaler therapy with advair 230-64mcg 2 puffs twice daily, fluticasone nasal spray and fexofenadine daily. She continues to experience intermittent cough though. She is to continue these therapies.  PFTs today show mild restriction and mild diffusion defect. We will obtain high resolution CT chest for further evaluation.   Follow up in 6 months.  Freda Jackson, MD [facility] Office: [phone]   Current Outpatient Medications:    apixaban (ELIQUIS) 5 MG  TABS tablet, Take 1 tablet (5 mg total) by mouth 2 (two) times daily., Disp: 180 tablet, Rfl: 3   fexofenadine (ALLEGRA) 180 MG tablet, Take 1 tablet (180 mg total) by mouth daily., Disp: 30 tablet, Rfl: 3   fluticasone (FLONASE) 50 MCG/ACT  nasal spray, Place 2 sprays into both nostrils daily., Disp: 11.1 mL, Rfl: 2   fluticasone-salmeterol (ADVAIR HFA) 230-21 MCG/ACT inhaler, Inhale 2 puffs into the lungs 2 (two) times daily., Disp: 1 each, Rfl: 12   gabapentin (NEURONTIN) 300 MG capsule, Take 300 mg by mouth at bedtime. , Disp: , Rfl:    metoprolol succinate (TOPROL-XL) 50 MG 24 hr tablet, Take 1 tablet (50 mg total) by mouth daily., Disp: 90 tablet, Rfl: 3   omeprazole (PRILOSEC) 40 MG capsule, Take 40 mg by mouth 2 (two) times daily., Disp: , Rfl:    QUEtiapine (SEROQUEL) 50 MG tablet, Take 50 mg by mouth at bedtime as needed (sleep). , Disp: , Rfl:    RYBELSUS 7 MG TABS, Take 1 tablet by mouth every morning., Disp: , Rfl:    zolpidem (AMBIEN) 10 MG tablet, Take 1 tablet (10 mg total) by mouth at bedtime as needed for sleep. Up 5 times weekly, Disp: 30 tablet, Rfl: 5

## 2021-11-12 NOTE — Patient Instructions (Addendum)
Continue advair inhaler 2 puffs twice daily - rinse mouth out after each use  Continue fexofenadine daily  Use flonase nasal spray as needed  We will check a high resolution CT Chest scan at [facility] to further evaluate your abnormal pulmonary function tests.   Follow up in 6 months

## 2021-11-15 ENCOUNTER — Encounter: Payer: Self-pay | Admitting: Pulmonary Disease

## 2022-01-07 IMAGING — DX DG CHEST 2V
2 series · 2 of 2 positions shown · non-contrast
Comparison: 07/05/2019.  01/30/2017.

CLINICAL DATA: Cough.

EXAM:
CHEST - 2 VIEW

[chest pa]
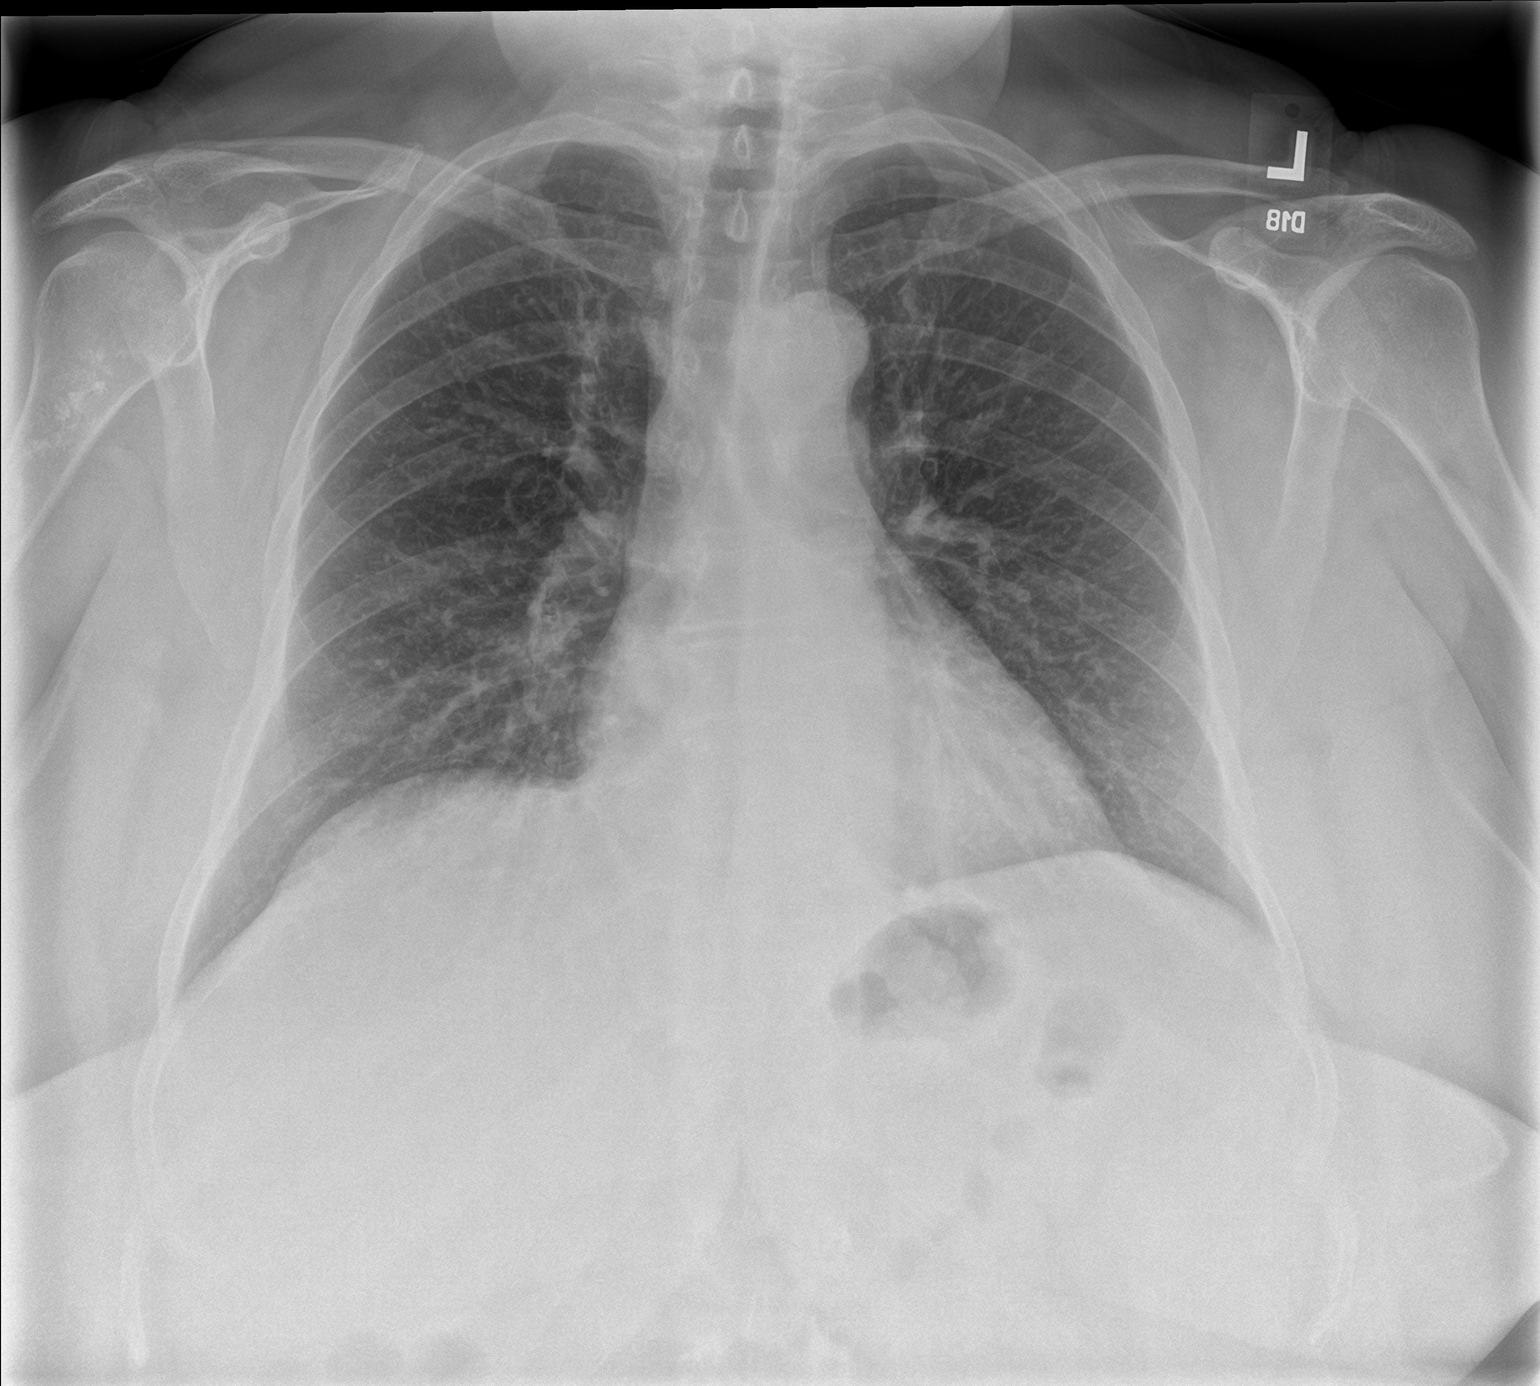

[chest lat]
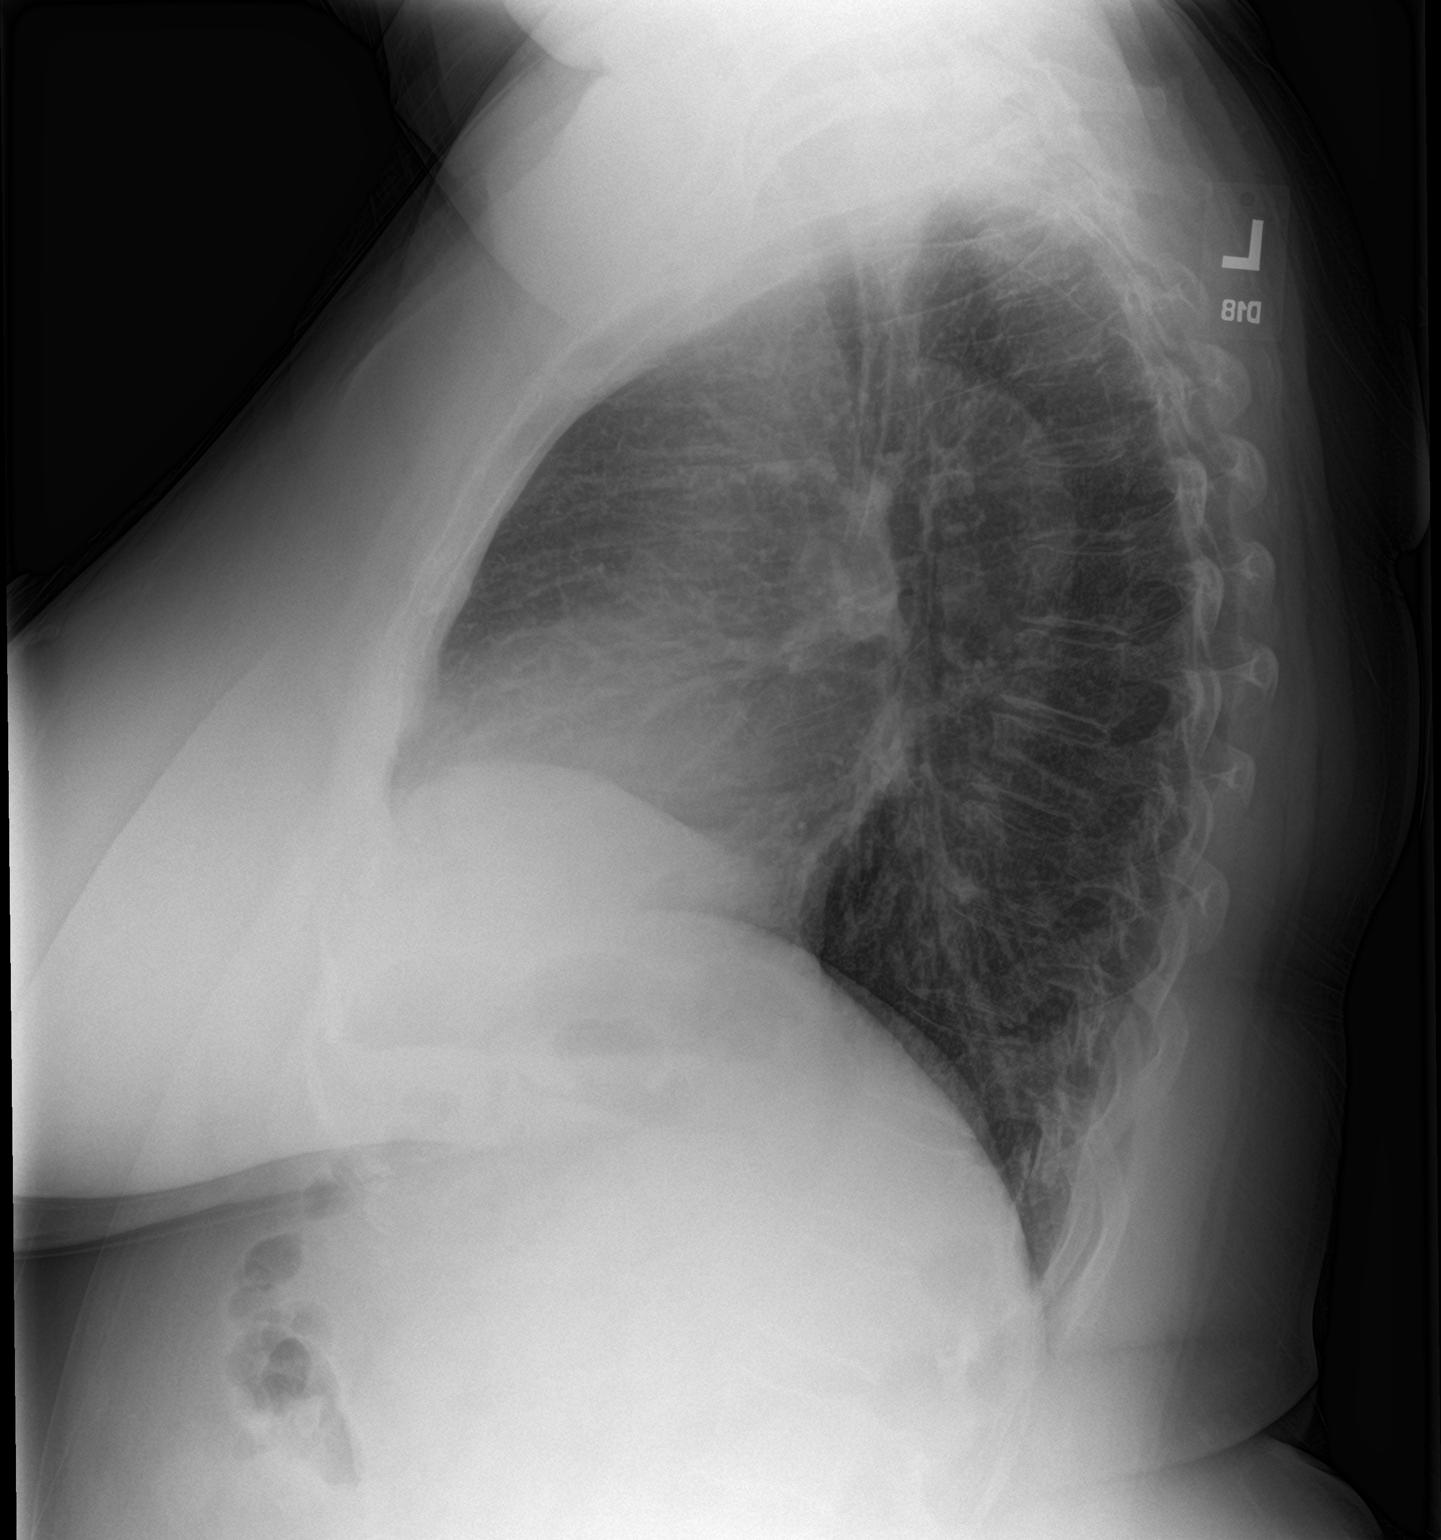

[2 of 2 positions shown; findings below may reference images not displayed]

FINDINGS: Mediastinum and hilar structures normal. Lungs are clear. No focal
infiltrate. Heart size normal. No pleural effusion or pneumothorax.
Mild lumbar spine degenerative change. No acute bony abnormality
identified.
IMPRESSION: No acute cardiopulmonary disease.

## 2022-02-03 ENCOUNTER — Ambulatory Visit: Payer: 59 | Admitting: Cardiology

## 2022-02-03 ENCOUNTER — Encounter: Payer: Self-pay | Admitting: Cardiology

## 2022-02-03 VITALS — BP 130/78 | HR 70 | Ht 64.0 in | Wt 230.2 lb

## 2022-02-03 DIAGNOSIS — D6869 Other thrombophilia: Secondary | ICD-10-CM

## 2022-02-03 DIAGNOSIS — I480 Paroxysmal atrial fibrillation: Secondary | ICD-10-CM | POA: Diagnosis not present

## 2022-02-03 MED ORDER — METOPROLOL SUCCINATE ER 50 MG PO TB24: 75.0000 mg | ORAL_TABLET | Freq: Every day | ORAL | 3 refills | Status: DC

## 2022-02-03 NOTE — Patient Instructions (Signed)
Medication Instructions:  ?Increase Toprol-XL to 75 mg tablets daily ? ?Follow-Up: ?Follow up with Dr. Harl Bowie in 6 months.  ? ?Any Other Special Instructions Will Be Listed Below (If Applicable). ? ? ? ? ?If you need a refill on your cardiac medications before your next appointment, please call your pharmacy. ? ?

## 2022-02-03 NOTE — Progress Notes (Signed)
? ? ? ?Clinical Summary ?Ms. Holly Krueger is a 56 y.o.femaleseen today for follow up of the following medical problems.  ?  ?PAF ?-has been on toprol '50mg'$  daily ?- reports some occasional palpitations ?- episode few days ago in AM. Woke up, felt heart racing. Checked heart rate at home and 146, took her '50mg'$  of toprol. Symptoms kept going, took '25mg'$  that night ?- taking eliquis, sometimes forgets to take AM.  ? ? ? ?Past Medical History:  ?Diagnosis Date  ? Atrial fibrillation ([place])   ? Endometriosis   ? GERD (gastroesophageal reflux disease)   ? Headache(784.0)   ? Hypertension   ? Kidney stone   ? PAF (paroxysmal atrial fibrillation) ([place])   ? SOB (shortness of breath)   ? Swelling of both lower extremities   ? ? ? ?No Known Allergies ? ? ?Current Outpatient Medications  ?Medication Sig Dispense Refill  ? apixaban (ELIQUIS) 5 MG TABS tablet Take 1 tablet (5 mg total) by mouth 2 (two) times daily. 180 tablet 3  ? fluticasone (FLONASE) 50 MCG/ACT nasal spray Place 2 sprays into both nostrils daily. 11.1 mL 2  ? fluticasone-salmeterol (ADVAIR HFA) 230-21 MCG/ACT inhaler Inhale 2 puffs into the lungs 2 (two) times daily. 1 each 12  ? gabapentin (NEURONTIN) 300 MG capsule Take 300 mg by mouth at bedtime.     ? metoprolol succinate (TOPROL XL) 50 MG 24 hr tablet Take 1.5 tablets (75 mg total) by mouth daily. Take with or immediately following a meal. 135 tablet 3  ? omeprazole (PRILOSEC) 40 MG capsule Take 40 mg by mouth 2 (two) times daily.    ? QUEtiapine (SEROQUEL) 50 MG tablet Take 50 mg by mouth at bedtime as needed (sleep).     ? RYBELSUS 7 MG TABS Take 1 tablet by mouth every morning.    ? zolpidem (AMBIEN) 10 MG tablet Take 1 tablet (10 mg total) by mouth at bedtime as needed for sleep. Up 5 times weekly 30 tablet 5  ? ?No current facility-administered medications for this visit.  ? ? ? ?Past Surgical History:  ?Procedure Laterality Date  ? ABDOMINAL HYSTERECTOMY    ? APPENDECTOMY    ? COLONOSCOPY N/A 10/02/2015  ?  Procedure: COLONOSCOPY;  Surgeon: Danie Binder, MD;  [facility];  Service: Endoscopy;  Laterality: N/A;  830 ?  ? FOOT SURGERY    ? GANGLION CYST EXCISION Right 09/23/2020  ? Procedure: EXCISION LIPOMA RIGHT FOOT;  Surgeon: Caprice Beaver, DPM;  Location: [facility];  Service: Podiatry;  Laterality: Right;  ? NASAL SINUS SURGERY  2014  ? ? ? ?No Known Allergies ? ? ? ?Family History  ?Problem Relation Age of Onset  ? Hyperlipidemia Mother   ? Hypertension Mother   ? Anxiety disorder Mother   ? Hypertension Father   ? Diabetes Father   ? Stroke Father   ? ? ? ?Social History ?Ms. Holly Krueger reports that she has never smoked. She has never used smokeless tobacco. ?Ms. Holly Krueger reports no history of alcohol use. ? ? ?Review of Systems ?CONSTITUTIONAL: No weight loss, fever, chills, weakness or fatigue.  ?HEENT: Eyes: No visual loss, blurred vision, double vision or yellow sclerae.No hearing loss, sneezing, congestion, runny nose or sore throat.  ?SKIN: No rash or itching.  ?CARDIOVASCULAR: per hpi ?RESPIRATORY: No shortness of breath, cough or sputum.  ?GASTROINTESTINAL: No anorexia, nausea, vomiting or diarrhea. No abdominal pain or blood.  ?GENITOURINARY: No burning on urination, no polyuria ?NEUROLOGICAL:  No headache, dizziness, syncope, paralysis, ataxia, numbness or tingling in the extremities. No change in bowel or bladder control.  ?MUSCULOSKELETAL: No muscle, back pain, joint pain or stiffness.  ?LYMPHATICS: No enlarged nodes. No history of splenectomy.  ?PSYCHIATRIC: No history of depression or anxiety.  ?ENDOCRINOLOGIC: No reports of sweating, cold or heat intolerance. No polyuria or polydipsia.  ?. ? ? ?Physical Examination ?Today's Vitals  ? 02/03/22 1430 02/03/22 1458  ?BP: (!) 150/96 130/78  ?Pulse: 70   ?SpO2: 100%   ?Weight: 230 lb 3.2 oz (104.4 kg)   ?Height: '5\' 4"'$  (1.626 m)   ? ?Body mass index is 39.51 kg/m?. ? ?Gen: resting comfortably, no acute distress ?HEENT: no scleral icterus, pupils equal  round and reactive, no palptable cervical adenopathy,  ?CV: RRR, no m/r/g no jvd ?Resp: Clear to auscultation bilaterally ?GI: abdomen is soft, non-tender, non-distended, normal bowel sounds, no hepatosplenomegaly ?MSK: extremities are warm, no edema.  ?Skin: warm, no rash ?Neuro:  no focal deficits ?Psych: appropriate affect ? ? ?Diagnostic Studies ?04/2019 ?IMPRESSIONS ?  ?  ? 1. The left ventricle has normal systolic function with an ejection fraction of 60-65%. The cavity size was normal. There is focal basal septal hypertrophy. Left ventricular diastolic parameters were normal. ? 2. The right ventricle has normal systolic function. The cavity was normal. There is no increase in right ventricular wall thickness. ? 3. Left atrial size was mildly dilated. ? 4. No evidence of mitral valve stenosis. ? 5. The aortic valve is tricuspid. No stenosis of the aortic valve. ? 6. The aorta is normal in size and structure. ? 7. The aortic root is normal in size and structure. ? 8. Pulmonary hypertension is indeterminate, inadequate TR jet. ?  ?  ? ? ?Assessment and Plan  ? ?1. PAF/acquired thrombophlia ?- recent significant episode of palpitations few days ago, resolved ? - EKG today shows NSR ?- we will increase toprol to '75mg'$  daily, continue eliquis. Room to titrate toprol further if needed ? ? ? ? ? ? ?Arnoldo Lenis, M.D. ?

## 2022-03-22 ENCOUNTER — Ambulatory Visit (HOSPITAL_COMMUNITY): Admission: RE | Admit: 2022-03-22 | Payer: 59 | Source: Ambulatory Visit

## 2022-04-28 ENCOUNTER — Telehealth: Payer: Self-pay | Admitting: Pulmonary Disease

## 2022-04-28 NOTE — Telephone Encounter (Signed)
We received a request for an updated FMLA form from [employer].  I called patient and left voice message.  I need to know if this is still needed and frequency and duration of absences.  Also need to know what the effective date should be for this form.  Last form was submitted on 10/06/2021.   Patient was to have a CT scan done on 6/6 and cancelled her appointment.  It was not rescheduled and she does not have an upcoming appointment.  The January AVS said she needed a follow-up in 6 months.  I will need to find out if Dr. Erin Fulling will sign this form without seeing her first.

## 2022-04-28 NOTE — Telephone Encounter (Signed)
I have talked to Dr. Erin Fulling and this patient will need to be seen in the office before he will complete and sign the updated FMLA form.  When she calls back, please make an appointment for a CT scan and a follow-up office visit.

## 2022-05-03 NOTE — Telephone Encounter (Addendum)
Please call patient to make a follow-up appointment with Dr. Erin Fulling.  She will also need to have a CT scan scheduled because she cancelled the last one and did not reschedule.   She has a FMLA form that needs to be completed and will need an office visit first.  Best number to reach her during the day is work# [phone].  2nd number is her cell# [phone]

## 2022-05-03 NOTE — Telephone Encounter (Signed)
Per Dr. Erin Fulling - he wants to see the patient for a follow-up prior to completing the FMLA paperwork.  In addition, she needs to have a CT scan prior to seeing him.   I spoke to patient this morning and she stated the FMLA dates on the form should be 04/16/2022 to 04/16/2023, episodes of 1 per month lasting 1-5 days each time.  She understands she will need to see Dr. Erin Fulling before he will sign the form.

## 2022-05-04 NOTE — Telephone Encounter (Signed)
Routing to [facility] to advise on rescheduling CT scan.

## 2022-05-05 NOTE — Telephone Encounter (Signed)
We have received another faxed request from [employer] Leave Absence for Dr.Dewald to complete FMLA paperwork.  I have faxed the form back to [employer] with a cover note explaining Dr. Erin Fulling will not complete the form until the patient has been seen for a CT scan and follow-up appointment.  Patient is aware of these requirements.  [employer] fax# [phone]

## 2022-05-17 ENCOUNTER — Other Ambulatory Visit: Payer: Self-pay | Admitting: *Deleted

## 2022-05-17 MED ORDER — METOPROLOL SUCCINATE ER 25 MG PO TB24: 25.0000 mg | ORAL_TABLET | Freq: Every day | ORAL | 3 refills | Status: DC

## 2022-05-17 MED ORDER — METOPROLOL SUCCINATE ER 50 MG PO TB24: 50.0000 mg | ORAL_TABLET | Freq: Every day | ORAL | 3 refills | Status: DC

## 2022-05-17 NOTE — Telephone Encounter (Signed)
Pt in office requesting refills on Metoprolol. Refill complete

## 2022-05-24 ENCOUNTER — Telehealth: Payer: Self-pay | Admitting: Internal Medicine

## 2022-05-24 NOTE — Telephone Encounter (Signed)
Can we please add this patient to my schedule tomorrow at 315 for follow-up visit, GERD.  Patient is already aware of time and location.  Thank you

## 2022-05-25 ENCOUNTER — Encounter: Payer: Self-pay | Admitting: Internal Medicine

## 2022-05-25 ENCOUNTER — Ambulatory Visit (INDEPENDENT_AMBULATORY_CARE_PROVIDER_SITE_OTHER): Payer: 59 | Admitting: Internal Medicine

## 2022-05-25 ENCOUNTER — Encounter (INDEPENDENT_AMBULATORY_CARE_PROVIDER_SITE_OTHER): Payer: Self-pay

## 2022-05-25 VITALS — BP 132/81 | HR 66 | Temp 98.1°F | Ht 64.0 in | Wt 203.0 lb

## 2022-05-25 DIAGNOSIS — R1319 Other dysphagia: Secondary | ICD-10-CM | POA: Diagnosis not present

## 2022-05-25 DIAGNOSIS — Z79899 Other long term (current) drug therapy: Secondary | ICD-10-CM | POA: Diagnosis not present

## 2022-05-25 DIAGNOSIS — R110 Nausea: Secondary | ICD-10-CM | POA: Diagnosis not present

## 2022-05-25 DIAGNOSIS — K219 Gastro-esophageal reflux disease without esophagitis: Secondary | ICD-10-CM

## 2022-05-25 MED ORDER — ONDANSETRON HCL 4 MG PO TABS: 4.0000 mg | ORAL_TABLET | Freq: Three times a day (TID) | ORAL | 2 refills | Status: DC | PRN

## 2022-05-25 MED ORDER — SUCRALFATE 1 G PO TABS: 1.0000 g | ORAL_TABLET | Freq: Three times a day (TID) | ORAL | 5 refills | Status: DC

## 2022-05-25 MED ORDER — PANTOPRAZOLE SODIUM 40 MG PO TBEC: 40.0000 mg | DELAYED_RELEASE_TABLET | Freq: Two times a day (BID) | ORAL | 11 refills | Status: DC

## 2022-05-25 NOTE — Progress Notes (Signed)
Primary Care Physician:  Ginger Organ Primary Gastroenterologist:  Dr. Abbey Chatters  Chief Complaint  Patient presents with   Follow-up    Patient here today to follow up on gerd. Patient taking omeprazole 40 mg bid and is not of much help.     HPI:   Holly Krueger is a 56 y.o. female who presents to the clinic today to discuss chronic reflux.  Has not been seen in our clinic before though did have colonoscopy by Dr. Oneida Alar in 2016 which was unremarkable/diverticulosis.  In regards to her chronic GERD, maintained on omeprazole 40 mg twice daily.  Has been on this medication for years.  States she has daily breakthrough symptoms.  Some nights it keeps her from falling asleep.  Notes burning sensation.  Also with progressively worsening esophageal dysphagia.  Notes intermittently feeling like food gets stuck in her substernal region.  When she has significant flares of her reflux she also has associated nausea with dry heaves.  Rare vomiting.  Chronically on Eliquis for atrial fibrillation.  No prior stroke.  Past Medical History:  Diagnosis Date   Atrial fibrillation ([place])    Endometriosis    GERD (gastroesophageal reflux disease)    Headache(784.0)    Hypertension    Kidney stone    PAF (paroxysmal atrial fibrillation) ([facility])    SOB (shortness of breath)    Swelling of both lower extremities     Past Surgical History:  Procedure Laterality Date   ABDOMINAL HYSTERECTOMY     APPENDECTOMY     COLONOSCOPY N/A 10/02/2015   Procedure: COLONOSCOPY;  Surgeon: Danie Binder, MD;  [facility];  Service: Endoscopy;  Laterality: N/A;  830    FOOT SURGERY     GANGLION CYST EXCISION Right 09/23/2020   Procedure: EXCISION LIPOMA RIGHT FOOT;  Surgeon: Caprice Beaver, DPM;  Location: [facility];  Service: Podiatry;  Laterality: Right;   NASAL SINUS SURGERY  2014    Current Outpatient Medications  Medication Sig Dispense Refill   apixaban (ELIQUIS) 5 MG TABS tablet  Take 1 tablet (5 mg total) by mouth 2 (two) times daily. 180 tablet 3   fluticasone (FLONASE) 50 MCG/ACT nasal spray Place 2 sprays into both nostrils daily. 11.1 mL 2   fluticasone-salmeterol (ADVAIR HFA) 230-21 MCG/ACT inhaler Inhale 2 puffs into the lungs 2 (two) times daily. 1 each 12   metoprolol succinate (TOPROL XL) 25 MG 24 hr tablet Take 1 tablet (25 mg total) by mouth daily. 90 tablet 3   metoprolol succinate (TOPROL XL) 50 MG 24 hr tablet Take 1 tablet (50 mg total) by mouth daily. Take with or immediately following a meal. 90 tablet 3   omeprazole (PRILOSEC) 40 MG capsule Take 40 mg by mouth 2 (two) times daily.     QUEtiapine (SEROQUEL) 50 MG tablet Take 50 mg by mouth at bedtime as needed (sleep).      Semaglutide-Weight Management (WEGOVY) 2.4 MG/0.75ML SOAJ Inject 2.4 mg into the skin once a week.     zolpidem (AMBIEN) 10 MG tablet Take 1 tablet (10 mg total) by mouth at bedtime as needed for sleep. Up 5 times weekly 30 tablet 5   No current facility-administered medications for this visit.    Allergies as of 05/25/2022   (No Known Allergies)    Family History  Problem Relation Age of Onset   Hyperlipidemia Mother    Hypertension Mother    Anxiety disorder Mother  Hypertension Father    Diabetes Father    Stroke Father     Social History   Socioeconomic History   Marital status: Married    Spouse name: Barbaraann Rondo   Number of children: Not on file   Years of education: Not on file   Highest education level: Not on file  Occupational History   Occupation: phlebotomist   Occupation: endoscopy tech  Tobacco Use   Smoking status: Never   Smokeless tobacco: Never  Vaping Use   Vaping Use: Never used  Substance and Sexual Activity   Alcohol use: No   Drug use: No   Sexual activity: Yes  Other Topics Concern   Not on file  Social History Narrative   Not on file   Social Determinants of Health   Financial Resource Strain: Not on file  Food Insecurity: Not  on file  Transportation Needs: Not on file  Physical Activity: Not on file  Stress: Not on file  Social Connections: Not on file  Intimate Partner Violence: Not on file    Subjective: Review of Systems  Constitutional:  Negative for chills and fever.  HENT:  Negative for congestion and hearing loss.   Eyes:  Negative for blurred vision and double vision.  Respiratory:  Negative for cough and shortness of breath.   Cardiovascular:  Negative for chest pain and palpitations.  Gastrointestinal:  Positive for heartburn and nausea. Negative for abdominal pain, blood in stool, constipation, diarrhea, melena and vomiting.  Genitourinary:  Negative for dysuria and urgency.  Musculoskeletal:  Negative for joint pain and myalgias.  Skin:  Negative for itching and rash.  Neurological:  Negative for dizziness and headaches.  Psychiatric/Behavioral:  Negative for depression. The patient is not nervous/anxious.        Objective: BP 132/81 (BP Location: Left Arm, Patient Position: Sitting, Cuff Size: Large)   Pulse 66   Temp 98.1 F (36.7 C) (Oral)   Ht '5\' 4"'$  (1.626 m)   Wt 203 lb (92.1 kg)   BMI 34.84 kg/m  Physical Exam Constitutional:      Appearance: Normal appearance.  HENT:     Head: Normocephalic and atraumatic.  Eyes:     Extraocular Movements: Extraocular movements intact.     Conjunctiva/sclera: Conjunctivae normal.  Cardiovascular:     Rate and Rhythm: Normal rate and regular rhythm.  Pulmonary:     Effort: Pulmonary effort is normal.     Breath sounds: Normal breath sounds.  Abdominal:     General: Bowel sounds are normal.     Palpations: Abdomen is soft.  Musculoskeletal:        General: No swelling. Normal range of motion.     Cervical back: Normal range of motion and neck supple.  Skin:    General: Skin is warm and dry.     Coloration: Skin is not jaundiced.  Neurological:     General: No focal deficit present.     Mental Status: She is alert and oriented to  person, place, and time.  Psychiatric:        Mood and Affect: Mood normal.        Behavior: Behavior normal.      Assessment: *Chronic GERD-not well-controlled *Esophageal dysphagia-intermittent *Nausea  Plan: Will schedule for EGD with possible dilation to evaluate for peptic ulcer disease, esophagitis, gastritis, H. Pylori, duodenitis, or other. Will also evaluate for esophageal stricture, Schatzki's ring, esophageal web or other.   The risks including infection, bleed, or perforation as  well as benefits, limitations, alternatives and imponderables have been reviewed with the patient. Potential for esophageal dilation, biopsy, etc. have also been reviewed.  Questions have been answered. All parties agreeable.  I will change patient's omeprazole to pantoprazole 40 mg twice daily.  Carafate to take on top of this as needed.  Zofran sent in for nausea.  Colonoscopy recall 2026.   05/25/2022 3:44 PM   Disclaimer: This note was dictated with voice recognition software. Similar sounding words can inadvertently be transcribed and may not be corrected upon review.

## 2022-05-25 NOTE — Patient Instructions (Signed)
We will schedule you for upper endoscopy to evaluate your chronic reflux as well as screen you for Barrett's esophagus.  I may perform esophageal dilation depending on findings.  You will need to hold your Eliquis x 48 hours prior to procedure.  I will switch your omeprazole to pantoprazole 40 mg twice daily.  Carafate also sent to your pharmacy to take up to 4 times a day as needed for breakthrough symptoms.  I have sent in a prescription for Zofran for your nausea.  Colonoscopy recall 2023.  It was very nice seeing you today.  Dr. Abbey Chatters

## 2022-05-27 ENCOUNTER — Telehealth: Payer: Self-pay | Admitting: *Deleted

## 2022-05-27 NOTE — Telephone Encounter (Signed)
[facility] to schedule procedure. 

## 2022-06-01 ENCOUNTER — Encounter: Payer: Self-pay | Admitting: *Deleted

## 2022-06-01 NOTE — Telephone Encounter (Signed)
Patient has appt scheduled for 07/01/22 at 10:15 am. Instructions and pre-op appt will be mailed to pt.

## 2022-06-02 NOTE — Telephone Encounter (Signed)
Per [facility] "Notification or Prior Authorization is not required for the requested services  This [employer] plan does not currently require a prior authorization for these services. If you have general questions about the prior authorization requirements, please call us at [phone] or visit [email or web address] > Clinician Resources > Advance and Admission Notification Requirements. The number above acknowledges your notification. Please write this number down for future reference. Notification is not a guarantee of coverage or payment.  Decision ID #:[id number]"

## 2022-06-28 ENCOUNTER — Encounter (HOSPITAL_COMMUNITY): Payer: Self-pay

## 2022-06-28 ENCOUNTER — Encounter (HOSPITAL_COMMUNITY)
Admission: RE | Admit: 2022-06-28 | Discharge: 2022-06-28 | Disposition: A | Discharge status: Home / Self Care | Payer: 59 | Source: Ambulatory Visit | Attending: Internal Medicine | Admitting: Internal Medicine

## 2022-06-28 HISTORY — DX: Sleep apnea, unspecified: G47.30

## 2022-06-28 HISTORY — DX: Cardiac arrhythmia, unspecified: I49.9

## 2022-06-28 HISTORY — DX: Personal history of urinary calculi: Z87.442

## 2022-06-28 HISTORY — DX: Other specified postprocedural states: Z98.890

## 2022-07-01 ENCOUNTER — Encounter (HOSPITAL_COMMUNITY)
Admission: RE | Disposition: A | Discharge status: Home / Self Care | Payer: Self-pay | Source: Home / Self Care | Attending: Internal Medicine

## 2022-07-01 ENCOUNTER — Encounter (HOSPITAL_COMMUNITY): Payer: Self-pay

## 2022-07-01 ENCOUNTER — Ambulatory Visit (HOSPITAL_COMMUNITY): Payer: 59 | Admitting: Anesthesiology

## 2022-07-01 ENCOUNTER — Ambulatory Visit (HOSPITAL_COMMUNITY)
Admission: RE | Admit: 2022-07-01 | Discharge: 2022-07-01 | Disposition: A | Discharge status: Home / Self Care | Payer: 59 | Attending: Internal Medicine | Admitting: Internal Medicine

## 2022-07-01 ENCOUNTER — Ambulatory Visit (HOSPITAL_BASED_OUTPATIENT_CLINIC_OR_DEPARTMENT_OTHER): Payer: 59 | Admitting: Anesthesiology

## 2022-07-01 DIAGNOSIS — Z79899 Other long term (current) drug therapy: Secondary | ICD-10-CM | POA: Diagnosis not present

## 2022-07-01 DIAGNOSIS — F32A Depression, unspecified: Secondary | ICD-10-CM | POA: Diagnosis not present

## 2022-07-01 DIAGNOSIS — K219 Gastro-esophageal reflux disease without esophagitis: Secondary | ICD-10-CM | POA: Diagnosis not present

## 2022-07-01 DIAGNOSIS — K2970 Gastritis, unspecified, without bleeding: Secondary | ICD-10-CM | POA: Insufficient documentation

## 2022-07-01 DIAGNOSIS — I10 Essential (primary) hypertension: Secondary | ICD-10-CM | POA: Diagnosis not present

## 2022-07-01 DIAGNOSIS — K222 Esophageal obstruction: Secondary | ICD-10-CM | POA: Diagnosis not present

## 2022-07-01 DIAGNOSIS — Z7901 Long term (current) use of anticoagulants: Secondary | ICD-10-CM | POA: Diagnosis not present

## 2022-07-01 DIAGNOSIS — Z7951 Long term (current) use of inhaled steroids: Secondary | ICD-10-CM | POA: Insufficient documentation

## 2022-07-01 DIAGNOSIS — I480 Paroxysmal atrial fibrillation: Secondary | ICD-10-CM | POA: Insufficient documentation

## 2022-07-01 DIAGNOSIS — K449 Diaphragmatic hernia without obstruction or gangrene: Secondary | ICD-10-CM | POA: Insufficient documentation

## 2022-07-01 DIAGNOSIS — G4730 Sleep apnea, unspecified: Secondary | ICD-10-CM | POA: Diagnosis not present

## 2022-07-01 DIAGNOSIS — R1310 Dysphagia, unspecified: Secondary | ICD-10-CM | POA: Diagnosis not present

## 2022-07-01 DIAGNOSIS — R110 Nausea: Secondary | ICD-10-CM

## 2022-07-01 HISTORY — PX: ESOPHAGOGASTRODUODENOSCOPY (EGD) WITH PROPOFOL: SHX5813

## 2022-07-01 HISTORY — PX: BIOPSY: SHX5522

## 2022-07-01 HISTORY — PX: BALLOON DILATION: SHX5330

## 2022-07-01 SURGERY — ESOPHAGOGASTRODUODENOSCOPY (EGD) WITH PROPOFOL
Anesthesia: General

## 2022-07-01 MED ORDER — PROPOFOL 10 MG/ML IV BOLUS
INTRAVENOUS | Status: DC | PRN
  Administered 2022-07-01: 60 mg via INTRAVENOUS
  Administered 2022-07-01: 40 mg via INTRAVENOUS
  Administered 2022-07-01: 50 mg via INTRAVENOUS
  Administered 2022-07-01: 100 mg via INTRAVENOUS

## 2022-07-01 MED ORDER — ONDANSETRON HCL 4 MG/2ML IJ SOLN
INTRAMUSCULAR | Status: DC | PRN
  Administered 2022-07-01: 4 mg via INTRAVENOUS

## 2022-07-01 MED ORDER — LACTATED RINGERS IV SOLN
INTRAVENOUS | Status: DC
  Administered 2022-07-01: 1000 mL via INTRAVENOUS

## 2022-07-01 MED ORDER — LIDOCAINE HCL (CARDIAC) PF 100 MG/5ML IV SOSY
PREFILLED_SYRINGE | INTRAVENOUS | Status: DC | PRN
  Administered 2022-07-01: 50 mg via INTRAVENOUS

## 2022-07-01 NOTE — Progress Notes (Signed)
Please excuse Holly Krueger from work on 07/01/2022.  His wife had a procedure at [facility] that required sedation.

## 2022-07-01 NOTE — Op Note (Signed)
[facility] Patient Name: Holly Krueger Procedure Date: 07/01/2022 10:46 AM MRN: [id number] Date of Birth: 1966-09-19 Attending MD: Elon Alas. Abbey Chatters DO CSN: [id number] Age: 56 Admit Type: Outpatient Procedure:                Upper GI endoscopy Indications:              Dysphagia, Heartburn Providers:                Elon Alas. Abbey Chatters, DO, [place] Page, Casimer Bilis, Technician Referring MD:             Elon Alas. Abbey Chatters, DO Medicines:                See the Anesthesia note for documentation of the                            administered medications Complications:            No immediate complications. Estimated Blood Loss:     Estimated blood loss was minimal. Procedure:                Pre-Anesthesia Assessment:                           - The anesthesia plan was to use monitored                            anesthesia care (MAC).                           After obtaining informed consent, the endoscope was                            passed under direct vision. Throughout the                            procedure, the patient's blood pressure, pulse, and                            oxygen saturations were monitored continuously. The                            GIF-H190 ([id number]) scope was introduced through the                            mouth, and advanced to the second part of duodenum.                            The upper GI endoscopy was accomplished without                            difficulty. The patient tolerated the procedure                            well. Scope In:  11:00:19 AM Scope Out: 11:04:53 AM Total Procedure Duration: 0 hours 4 minutes 34 seconds  Findings:      A 3 cm hiatal hernia was present.      A mild Schatzki ring was found in the lower third of the esophagus. A       TTS dilator was passed through the scope. Dilation with an 18-19-20 mm       balloon dilator was performed to 19 mm. The dilation site was examined        and showed mild mucosal disruption and moderate improvement in luminal       narrowing.      Patchy mild inflammation characterized by erythema was found in the       gastric body and in the gastric antrum. Biopsies were taken with a cold       forceps for Helicobacter pylori testing.      The duodenal bulb, first portion of the duodenum and second portion of       the duodenum were normal. Impression:               - 3 cm hiatal hernia.                           - Mild Schatzki ring. Dilated.                           - Gastritis. Biopsied.                           - Normal duodenal bulb, first portion of the                            duodenum and second portion of the duodenum. Moderate Sedation:      Per Anesthesia Care Recommendation:           - Patient has a contact number available for                            emergencies. The signs and symptoms of potential                            delayed complications were discussed with the                            patient. Return to normal activities tomorrow.                            Written discharge instructions were provided to the                            patient.                           - Resume previous diet.                           - Continue present medications.                           -  Await pathology results.                           - Repeat upper endoscopy PRN for retreatment.                           - Return to GI clinic PRN.                           - Use Protonix (pantoprazole) 40 mg PO BID. Procedure Code(s):        --- Professional ---                           [id number], Esophagogastroduodenoscopy, flexible,                            transoral; with transendoscopic balloon dilation of                            esophagus (less than 30 mm diameter)                           43239, 59, Esophagogastroduodenoscopy, flexible,                            transoral; with biopsy, single or multiple Diagnosis  Code(s):        --- Professional ---                           K44.9, Diaphragmatic hernia without obstruction or                            gangrene                           K22.2, Esophageal obstruction                           K29.70, Gastritis, unspecified, without bleeding                           R13.10, Dysphagia, unspecified                           R12, Heartburn CPT copyright 2019 American Medical Association. All rights reserved. The codes documented in this report are preliminary and upon coder review may  be revised to meet current compliance requirements. Elon Alas. Abbey Chatters, DO [place] Abbey Chatters, DO 07/01/2022 11:09:30 AM This report has been signed electronically. Number of Addenda: 0

## 2022-07-01 NOTE — Transfer of Care (Signed)
Immediate Anesthesia Transfer of Care Note  Patient: Holly Krueger  Procedure(s) Performed: ESOPHAGOGASTRODUODENOSCOPY (EGD) WITH PROPOFOL BALLOON DILATION BIOPSY  Patient Location: Short Stay  Anesthesia Type:General  Level of Consciousness: drowsy  Airway & Oxygen Therapy: Patient Spontanous Breathing  Post-op Assessment: Report given to RN and Post -op Vital signs reviewed and stable  Post vital signs: Reviewed and stable  Last Vitals:  Vitals Value Taken Time  BP 124/99 07/01/22 1111  Temp 36.5 C 07/01/22 1111  Pulse 74 07/01/22 1111  Resp 21 07/01/22 1111  SpO2 93 % 07/01/22 1111    Last Pain:  Vitals:   07/01/22 1111  TempSrc: Oral  PainSc:       Patients Stated Pain Goal: 7 (15/87/27 6184)  Complications: No notable events documented.

## 2022-07-01 NOTE — Anesthesia Postprocedure Evaluation (Signed)
Anesthesia Post Note  Patient: JENNIGER FIGIEL  Procedure(s) Performed: ESOPHAGOGASTRODUODENOSCOPY (EGD) WITH PROPOFOL BALLOON DILATION BIOPSY  Patient location during evaluation: Phase II Anesthesia Type: General Level of consciousness: awake and alert and oriented Pain management: pain level controlled Vital Signs Assessment: post-procedure vital signs reviewed and stable Respiratory status: spontaneous breathing, nonlabored ventilation and respiratory function stable Cardiovascular status: blood pressure returned to baseline and stable Postop Assessment: no apparent nausea or vomiting Anesthetic complications: no   No notable events documented.   Last Vitals:  Vitals:   07/01/22 0947 07/01/22 1111  BP: (!) 154/85 (!) 124/99  Pulse:  74  Resp: 18 (!) 21  Temp: 36.9 C 36.5 C  SpO2: 100% 93%    Last Pain:  Vitals:   07/01/22 1111  TempSrc: Oral  PainSc:                  Brieonna Crutcher C Natascha Edmonds

## 2022-07-01 NOTE — H&P (Signed)
Primary Care Physician:  Ginger Organ Primary Gastroenterologist:  Dr. Abbey Chatters  Pre-Procedure History & Physical: HPI:  Holly Krueger is a 56 y.o. female is here for an EGD with possible dilation due to chronic GERD, esophageal dysphagia.  Past Medical History:  Diagnosis Date   Atrial fibrillation ([place])    Dysrhythmia    Endometriosis    GERD (gastroesophageal reflux disease)    Headache(784.0)    History of kidney stones    Hypertension    PAF (paroxysmal atrial fibrillation) (HCC)    PONV (postoperative nausea and vomiting)    Sleep apnea    SOB (shortness of breath)    Swelling of both lower extremities     Past Surgical History:  Procedure Laterality Date   ABDOMINAL HYSTERECTOMY     APPENDECTOMY     COLONOSCOPY N/A 10/02/2015   Procedure: COLONOSCOPY;  Surgeon: Danie Binder, MD;  Location: [facility];  Service: Endoscopy;  Laterality: N/A;  830    FOOT SURGERY     GANGLION CYST EXCISION Right 09/23/2020   Procedure: EXCISION LIPOMA RIGHT FOOT;  Surgeon: Caprice Beaver, DPM;  Location: [facility];  Service: Podiatry;  Laterality: Right;   NASAL SINUS SURGERY  2014    Prior to Admission medications   Medication Sig Start Date End Date Taking? Authorizing Provider  fluticasone (FLONASE) 50 MCG/ACT nasal spray Place 2 sprays into both nostrils daily. 09/21/21  Yes Freddi Starr, MD  fluticasone-salmeterol (ADVAIR HFA) [phone] MCG/ACT inhaler Inhale 2 puffs into the lungs 2 (two) times daily. 09/21/21  Yes Freddi Starr, MD  metoprolol succinate (TOPROL XL) 25 MG 24 hr tablet Take 1 tablet (25 mg total) by mouth daily. 05/17/22  Yes Strader, [place] M, PA-C  metoprolol succinate (TOPROL XL) 50 MG 24 hr tablet Take 1 tablet (50 mg total) by mouth daily. Take with or immediately following a meal. 05/17/22  Yes Strader, Humphrey, PA-C  ondansetron (ZOFRAN) 4 MG tablet Take 1 tablet (4 mg total) by mouth every 8 (eight) hours as needed for nausea or  vomiting. 05/25/22  Yes Eloise Harman, DO  pantoprazole (PROTONIX) 40 MG tablet Take 1 tablet (40 mg total) by mouth 2 (two) times daily before a meal. 05/25/22 05/25/23 Yes Lela Murfin K, DO  QUEtiapine (SEROQUEL) 50 MG tablet Take 50 mg by mouth at bedtime as needed (sleep).  04/01/19  Yes [provider]  sucralfate (CARAFATE) 1 g tablet Take 1 tablet (1 g total) by mouth 4 (four) times daily -  with meals and at bedtime. 05/25/22 11/21/22 Yes Sarrinah Gardin, Elon Alas, DO  zolpidem (AMBIEN) 10 MG tablet Take 1 tablet (10 mg total) by mouth at bedtime as needed for sleep. Up 5 times weekly 03/21/13  Yes Jonnie Kind, MD  apixaban (ELIQUIS) 5 MG TABS tablet Take 1 tablet (5 mg total) by mouth 2 (two) times daily. 06/11/21   Strader, Fransisco Hertz, PA-C  Semaglutide-Weight Management (WEGOVY) 2.4 MG/0.75ML SOAJ Inject 2.4 mg into the skin once a week.    [provider]    Allergies as of 06/01/2022   (No Known Allergies)    Family History  Problem Relation Age of Onset   Hyperlipidemia Mother    Hypertension Mother    Anxiety disorder Mother    Hypertension Father    Diabetes Father    Stroke Father     Social History   Socioeconomic History   Marital status: Married  Spouse name: Barbaraann Rondo   Number of children: Not on file   Years of education: Not on file   Highest education level: Not on file  Occupational History   Occupation: phlebotomist   Occupation: endoscopy tech  Tobacco Use   Smoking status: Never   Smokeless tobacco: Never  Vaping Use   Vaping Use: Never used  Substance and Sexual Activity   Alcohol use: No   Drug use: No   Sexual activity: Yes  Other Topics Concern   Not on file  Social History Narrative   Not on file   Social Determinants of Health   Financial Resource Strain: Not on file  Food Insecurity: Not on file  Transportation Needs: Not on file  Physical Activity: Not on file  Stress: Not on file  Social Connections: Not on file   Intimate Partner Violence: Not on file    Review of Systems: General: Negative for fever, chills, fatigue, weakness. Eyes: Negative for vision changes.  ENT: Negative for hoarseness, difficulty swallowing , nasal congestion. CV: Negative for chest pain, angina, palpitations, dyspnea on exertion, peripheral edema.  Respiratory: Negative for dyspnea at rest, dyspnea on exertion, cough, sputum, wheezing.  GI: See history of present illness. GU:  Negative for dysuria, hematuria, urinary incontinence, urinary frequency, nocturnal urination.  MS: Negative for joint pain, low back pain.  Derm: Negative for rash or itching.  Neuro: Negative for weakness, abnormal sensation, seizure, frequent headaches, memory loss, confusion.  Psych: Negative for anxiety, depression Endo: Negative for unusual weight change.  Heme: Negative for bruising or bleeding. Allergy: Negative for rash or hives.  Physical Exam: Vital signs in last 24 hours: Temp:  [98.4 F (36.9 C)] 98.4 F (36.9 C) (09/15 0947) Resp:  [18] 18 (09/15 0947) BP: (154)/(85) 154/85 (09/15 0947) SpO2:  [100 %] 100 % (09/15 0947)   General:   Alert,  Well-developed, well-nourished, pleasant and cooperative in NAD Head:  Normocephalic and atraumatic. Eyes:  Sclera clear, no icterus.   Conjunctiva pink. Ears:  Normal auditory acuity. Nose:  No deformity, discharge,  or lesions. Mouth:  No deformity or lesions, dentition normal. Neck:  Supple; no masses or thyromegaly. Lungs:  Clear throughout to auscultation.   No wheezes, crackles, or rhonchi. No acute distress. Heart:  Regular rate and rhythm; no murmurs, clicks, rubs,  or gallops. Abdomen:  Soft, nontender and nondistended. No masses, hepatosplenomegaly or hernias noted. Normal bowel sounds, without guarding, and without rebound.   Msk:  Symmetrical without gross deformities. Normal posture. Extremities:  Without clubbing or edema. Neurologic:  Alert and  oriented x4;  grossly  normal neurologically. Skin:  Intact without significant lesions or rashes. Cervical Nodes:  No significant cervical adenopathy. Psych:  Alert and cooperative. Normal mood and affect.   Impression/Plan: Holly Krueger is here for an EGD with possible dilation due to chronic GERD, esophageal dysphagia.  Risks, benefits, limitations, imponderables and alternatives regarding EGD have been reviewed with the patient. Questions have been answered. All parties agreeable.

## 2022-07-01 NOTE — Anesthesia Preprocedure Evaluation (Signed)
Anesthesia Evaluation  Patient identified by MRN, date of birth, ID band Patient awake    Reviewed: Allergy & Precautions, NPO status , Patient's Chart, lab work & pertinent test results, reviewed documented beta blocker date and time   History of Anesthesia Complications (+) PONV and history of anesthetic complications  Airway Mallampati: II  TM Distance: >3 FB Neck ROM: Full    Dental  (+) Dental Advisory Given, Teeth Intact   Pulmonary shortness of breath and with exertion, sleep apnea ,           Cardiovascular hypertension, Pt. on medications and Pt. on home beta blockers Normal cardiovascular exam+ dysrhythmias Atrial Fibrillation  Rhythm:Regular Rate:Normal     Neuro/Psych  Headaches, PSYCHIATRIC DISORDERS Depression    GI/Hepatic Neg liver ROS, GERD  Medicated,  Endo/Other  negative endocrine ROS  Renal/GU negative Renal ROS     Musculoskeletal   Abdominal   Peds  Hematology   Anesthesia Other Findings   Reproductive/Obstetrics                            Anesthesia Physical Anesthesia Plan  ASA: 3  Anesthesia Plan: General   Post-op Pain Management: Minimal or no pain anticipated   Induction: Intravenous  PONV Risk Score and Plan: Propofol infusion  Airway Management Planned: Nasal Cannula and Natural Airway  Additional Equipment:   Intra-op Plan:   Post-operative Plan:   Informed Consent: I have reviewed the patients History and Physical, chart, labs and discussed the procedure including the risks, benefits and alternatives for the proposed anesthesia with the patient or authorized representative who has indicated his/her understanding and acceptance.     Dental advisory given  Plan Discussed with: CRNA and Surgeon  Anesthesia Plan Comments:         Anesthesia Quick Evaluation

## 2022-07-01 NOTE — Discharge Instructions (Addendum)
EGD Discharge instructions Please read the instructions outlined below and refer to this sheet in the next few weeks. These discharge instructions provide you with general information on caring for yourself after you leave the hospital. Your doctor may also give you specific instructions. While your treatment has been planned according to the most current medical practices available, unavoidable complications occasionally occur. If you have any problems or questions after discharge, please call your doctor. ACTIVITY You may resume your regular activity but move at a slower pace for the next 24 hours.  Take frequent rest periods for the next 24 hours.  Walking will help expel (get rid of) the air and reduce the bloated feeling in your abdomen.  No driving for 24 hours (because of the anesthesia (medicine) used during the test).  You may shower.  Do not sign any important legal documents or operate any machinery for 24 hours (because of the anesthesia used during the test).  NUTRITION Drink plenty of fluids.  You may resume your normal diet.  Begin with a light meal and progress to your normal diet.  Avoid alcoholic beverages for 24 hours or as instructed by your caregiver.  MEDICATIONS You may resume your normal medications unless your caregiver tells you otherwise.  WHAT YOU CAN EXPECT TODAY You may experience abdominal discomfort such as a feeling of fullness or "gas" pains.  FOLLOW-UP Your doctor will discuss the results of your test with you.  SEEK IMMEDIATE MEDICAL ATTENTION IF ANY OF THE FOLLOWING OCCUR: Excessive nausea (feeling sick to your stomach) and/or vomiting.  Severe abdominal pain and distention (swelling).  Trouble swallowing.  Temperature over 101 F (37.8 C).  Rectal bleeding or vomiting of blood.   Your EGD revealed mild amount inflammation in your stomach.  I took biopsies of this to rule out infection with a bacteria called H. pylori.  Await pathology results, my  office will contact you.  You have a 3 cm hiatal hernia.  You also had a Schatzki's ring at the lower portion of your esophagus.  I stretched this with a balloon today.  Hopefully this helps with your swallowing.  Continue on pantoprazole twice daily.   I hope you have a great rest of your week!  Holly Krueger. Abbey Chatters, D.O. Gastroenterology and Hepatology [facility]  Resume Eliquis tomorrow.

## 2022-07-05 LAB — SURGICAL PATHOLOGY

## 2022-07-06 ENCOUNTER — Other Ambulatory Visit (HOSPITAL_COMMUNITY)
Admission: RE | Admit: 2022-07-06 | Discharge: 2022-07-06 | Disposition: A | Discharge status: Home / Self Care | Payer: 59 | Source: Ambulatory Visit | Attending: Internal Medicine | Admitting: Internal Medicine

## 2022-07-06 ENCOUNTER — Telehealth: Payer: Self-pay | Admitting: Internal Medicine

## 2022-07-06 DIAGNOSIS — K2970 Gastritis, unspecified, without bleeding: Secondary | ICD-10-CM

## 2022-07-06 NOTE — Telephone Encounter (Signed)
I called and discussed path results with patient.  Gastric biopsy showed inflammation with negative stains for H. pylori though concerned that she may have H. pylori based on the amount of inflammation in her stomach.  We will order H. pylori blood test.  If negative then we can safely rule out H. pylori.  If positive, we will need to proceed with breath testing or stool antigen which will require stopping her PPI x14 days.  Patient is agreeable.

## 2022-07-07 LAB — MISC LABCORP TEST (SEND OUT): Labcorp test code: 163683

## 2022-07-08 ENCOUNTER — Encounter (HOSPITAL_COMMUNITY): Payer: Self-pay | Admitting: Internal Medicine

## 2022-08-05 ENCOUNTER — Other Ambulatory Visit (HOSPITAL_COMMUNITY): Payer: Self-pay | Admitting: Family Medicine

## 2022-08-05 DIAGNOSIS — Z1231 Encounter for screening mammogram for malignant neoplasm of breast: Secondary | ICD-10-CM

## 2022-08-18 ENCOUNTER — Ambulatory Visit: Payer: 59 | Admitting: Cardiology

## 2022-08-18 ENCOUNTER — Ambulatory Visit (HOSPITAL_COMMUNITY)
Admission: RE | Admit: 2022-08-18 | Discharge: 2022-08-18 | Disposition: A | Discharge status: Home / Self Care | Payer: 59 | Source: Ambulatory Visit | Attending: Family Medicine | Admitting: Family Medicine

## 2022-08-18 ENCOUNTER — Encounter: Payer: Self-pay | Admitting: Cardiology

## 2022-08-18 VITALS — BP 108/70 | HR 75 | Ht 64.0 in | Wt 192.6 lb

## 2022-08-18 DIAGNOSIS — Z1231 Encounter for screening mammogram for malignant neoplasm of breast: Secondary | ICD-10-CM | POA: Diagnosis present

## 2022-08-18 DIAGNOSIS — D6869 Other thrombophilia: Secondary | ICD-10-CM

## 2022-08-18 DIAGNOSIS — I480 Paroxysmal atrial fibrillation: Secondary | ICD-10-CM | POA: Insufficient documentation

## 2022-08-18 NOTE — Progress Notes (Signed)
Clinical Summary Ms. Pemberton is a 56 y.o.female seen today for follow up of the following medical problems.    PAF -has been on toprol '50mg'$  daily - reports some occasional palpitations - episode few days ago in AM. Woke up, felt heart racing. Checked heart rate at home and 146, took her '50mg'$  of toprol. Symptoms kept going, took '25mg'$  that night - taking eliquis, sometimes forgets to take AM.   -no recent symptoms - compliant with meds - no bleeding on eliquis.   2. Weight loss - on wegovy, down 50 lbs.    Works in PACU at [employer]   Past Medical History:  Diagnosis Date   Atrial fibrillation ([place])    Dysrhythmia    Endometriosis    GERD (gastroesophageal reflux disease)    Headache(784.0)    History of kidney stones    Hypertension    PAF (paroxysmal atrial fibrillation) (HCC)    PONV (postoperative nausea and vomiting)    Sleep apnea    SOB (shortness of breath)    Swelling of both lower extremities      No Known Allergies   Current Outpatient Medications  Medication Sig Dispense Refill   apixaban (ELIQUIS) 5 MG TABS tablet Take 1 tablet (5 mg total) by mouth 2 (two) times daily. 180 tablet 3   fluticasone (FLONASE) 50 MCG/ACT nasal spray Place 2 sprays into both nostrils daily. 11.1 mL 2   fluticasone-salmeterol (ADVAIR HFA) 230-21 MCG/ACT inhaler Inhale 2 puffs into the lungs 2 (two) times daily. 1 each 12   metoprolol succinate (TOPROL XL) 25 MG 24 hr tablet Take 1 tablet (25 mg total) by mouth daily. 90 tablet 3   metoprolol succinate (TOPROL XL) 50 MG 24 hr tablet Take 1 tablet (50 mg total) by mouth daily. Take with or immediately following a meal. 90 tablet 3   ondansetron (ZOFRAN) 4 MG tablet Take 1 tablet (4 mg total) by mouth every 8 (eight) hours as needed for nausea or vomiting. 30 tablet 2   pantoprazole (PROTONIX) 40 MG tablet Take 1 tablet (40 mg total) by mouth 2 (two) times daily before a meal. 60 tablet 11   QUEtiapine (SEROQUEL) 50 MG tablet  Take 50 mg by mouth at bedtime as needed (sleep).      Semaglutide-Weight Management (WEGOVY) 2.4 MG/0.75ML SOAJ Inject 2.4 mg into the skin once a week.     sucralfate (CARAFATE) 1 g tablet Take 1 tablet (1 g total) by mouth 4 (four) times daily -  with meals and at bedtime. 120 tablet 5   zolpidem (AMBIEN) 10 MG tablet Take 1 tablet (10 mg total) by mouth at bedtime as needed for sleep. Up 5 times weekly 30 tablet 5   No current facility-administered medications for this visit.     Past Surgical History:  Procedure Laterality Date   ABDOMINAL HYSTERECTOMY     APPENDECTOMY     BALLOON DILATION N/A 07/01/2022   Procedure: BALLOON DILATION;  Surgeon: Eloise Harman, DO;  [facility];  Service: Endoscopy;  Laterality: N/A;   BIOPSY  07/01/2022   Procedure: BIOPSY;  Surgeon: Eloise Harman, DO;  [facility];  Service: Endoscopy;;   COLONOSCOPY N/A 10/02/2015   Procedure: COLONOSCOPY;  Surgeon: Danie Binder, MD;  Location: [facility];  Service: Endoscopy;  Laterality: N/A;  830    ESOPHAGOGASTRODUODENOSCOPY (EGD) WITH PROPOFOL N/A 07/01/2022   Procedure: ESOPHAGOGASTRODUODENOSCOPY (EGD) WITH PROPOFOL;  Surgeon:  Eloise Harman, DO;  [facility];  Service: Endoscopy;  Laterality: N/A;  10:15 am   FOOT SURGERY     GANGLION CYST EXCISION Right 09/23/2020   Procedure: EXCISION LIPOMA RIGHT FOOT;  Surgeon: Caprice Beaver, DPM;  Location: [facility];  Service: Podiatry;  Laterality: Right;   NASAL SINUS SURGERY  2014     No Known Allergies    Family History  Problem Relation Age of Onset   Hyperlipidemia Mother    Hypertension Mother    Anxiety disorder Mother    Hypertension Father    Diabetes Father    Stroke Father      Social History Ms. Vetter reports that she has never smoked. She has never used smokeless tobacco. Ms. Yearick reports no history of alcohol use.   Review of Systems CONSTITUTIONAL: No weight loss, fever, chills,  weakness or fatigue.  HEENT: Eyes: No visual loss, blurred vision, double vision or yellow sclerae.No hearing loss, sneezing, congestion, runny nose or sore throat.  SKIN: No rash or itching.  CARDIOVASCULAR: per hpi RESPIRATORY: No shortness of breath, cough or sputum.  GASTROINTESTINAL: No anorexia, nausea, vomiting or diarrhea. No abdominal pain or blood.  GENITOURINARY: No burning on urination, no polyuria NEUROLOGICAL: No headache, dizziness, syncope, paralysis, ataxia, numbness or tingling in the extremities. No change in bowel or bladder control.  MUSCULOSKELETAL: No muscle, back pain, joint pain or stiffness.  LYMPHATICS: No enlarged nodes. No history of splenectomy.  PSYCHIATRIC: No history of depression or anxiety.  ENDOCRINOLOGIC: No reports of sweating, cold or heat intolerance. No polyuria or polydipsia.  Marland Kitchen   Physical Examination Today's Vitals   08/18/22 1600  BP: 108/70  Pulse: 75  SpO2: 97%  Weight: 192 lb 9.6 oz (87.4 kg)  Height: '5\' 4"'$  (1.626 m)   Body mass index is 33.06 kg/m.  Gen: resting comfortably, no acute distress HEENT: no scleral icterus, pupils equal round and reactive, no palptable cervical adenopathy,  CV: RRR, no m/r/g no jvd Resp: Clear to auscultation bilaterally GI: abdomen is soft, non-tender, non-distended, normal bowel sounds, no hepatosplenomegaly MSK: extremities are warm, no edema.  Skin: warm, no rash Neuro:  no focal deficits Psych: appropriate affect   Diagnostic Studies     Assessment and Plan   1. PAF/acquired thrombophlia -no recent symptoms, doing well since we increased toprol to '75mg'$  daily - continue current meds including eliquis for stroke prevention      Arnoldo Lenis, M.D.

## 2022-08-18 NOTE — Patient Instructions (Signed)
Medication Instructions:  Your physician recommends that you continue on your current medications as directed. Please refer to the Current Medication list given to you today.   Labwork: None  Testing/Procedures: None  Follow-Up: Follow up in 6 months.   Any Other Special Instructions Will Be Listed Below (If Applicable).     If you need a refill on your cardiac medications before your next appointment, please call your pharmacy.

## 2022-09-22 ENCOUNTER — Telehealth: Payer: Self-pay

## 2022-09-22 MED ORDER — METOPROLOL TARTRATE 25 MG PO TABS: ORAL_TABLET | ORAL | 1 refills | Status: DC

## 2022-09-22 NOTE — Telephone Encounter (Signed)
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 Can we continue her toprol but add lopressor '25mg'$  po q8hrs prn palpitations please   Now JB    E-scribed to listed pharmacy.

## 2022-11-12 ENCOUNTER — Other Ambulatory Visit: Payer: Self-pay | Admitting: Internal Medicine

## 2022-11-18 ENCOUNTER — Other Ambulatory Visit: Payer: Self-pay | Admitting: Cardiology

## 2022-11-18 IMAGING — CT CT ABD-PELV W/ CM
2 of 5 series · 16 of 46 positions shown, 18 images · IV contrast (Omnipaque or Isovue)
Comparison: None.

CLINICAL DATA: Nausea x 1.5 with LLQ pain. History of appy, hyster.
Patient denies hypertension does not take meds for HTN

EXAM:
CT ABDOMEN AND PELVIS WITH CONTRAST
TECHNIQUE: Multidetector CT imaging of the abdomen and pelvis was performed
using the standard protocol following bolus administration of
intravenous contrast.
CONTRAST:  100mL OMNIPAQUE IOHEXOL 300 MG/ML  SOLN

[Series 2: axial st · axial · 0.88mm/px · z∈[-254,+171]mm · 13 of 99 slices shown, 15 images]
[im 7/99  soft-tissue]
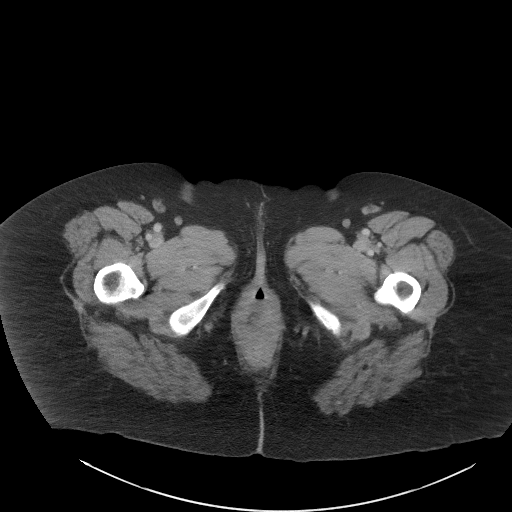
[im 7/99  bone]
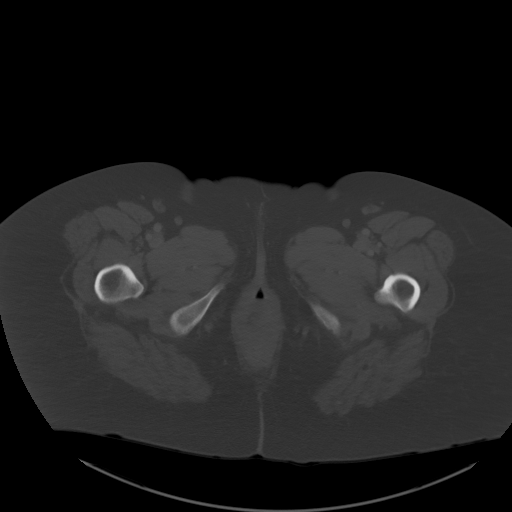
[im 14/99  soft-tissue]
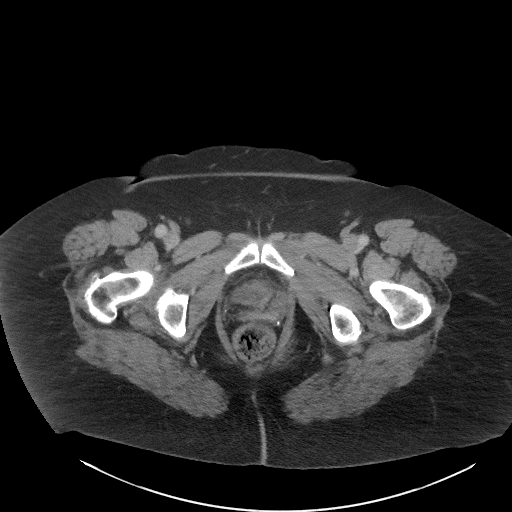
[im 20/99  soft-tissue]
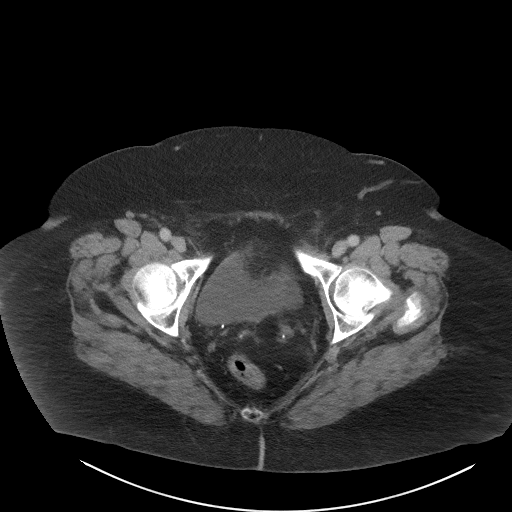
[im 27/99  soft-tissue]
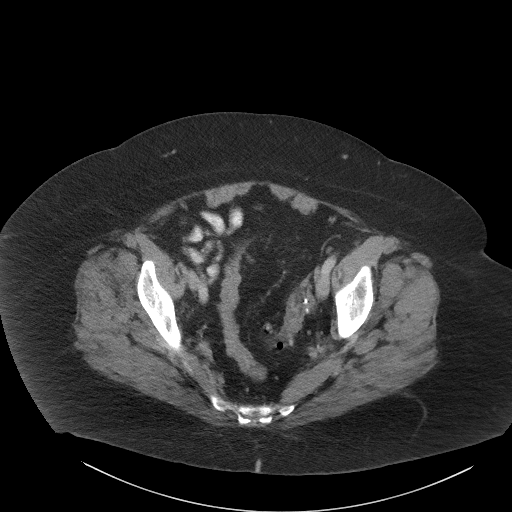
[im 33/99  soft-tissue]
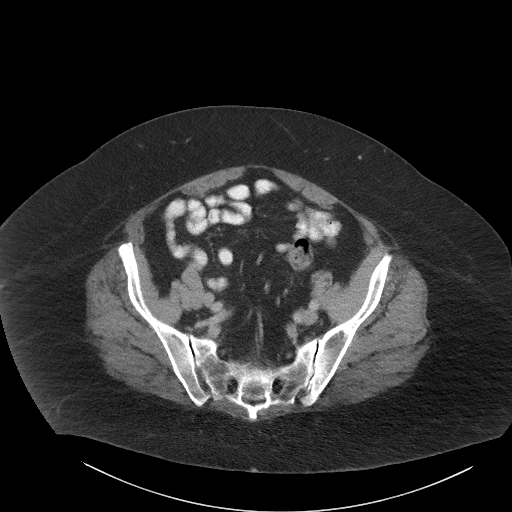
[im 40/99  soft-tissue]
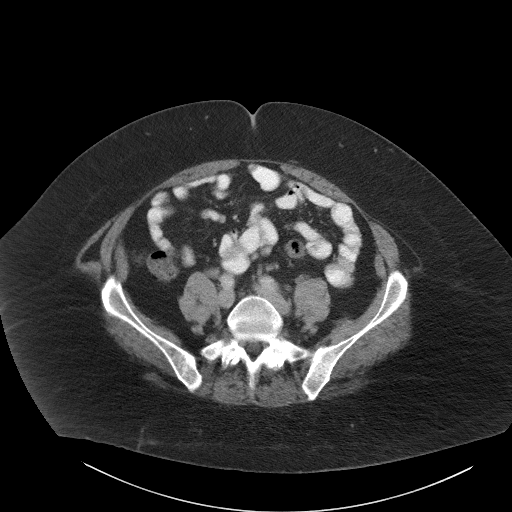
[im 53/99  soft-tissue]
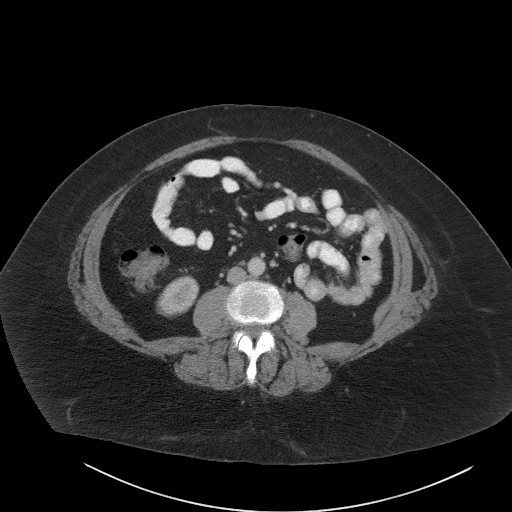
[im 59/99  soft-tissue]
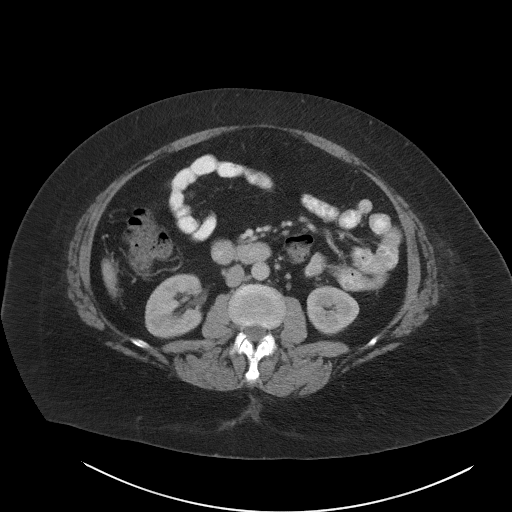
[im 66/99  soft-tissue]
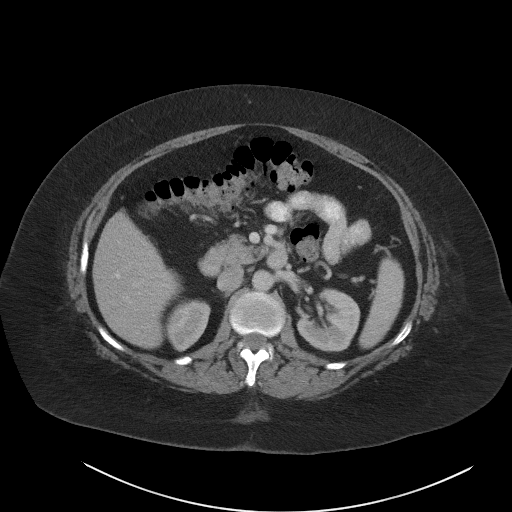
[im 66/99  bone]
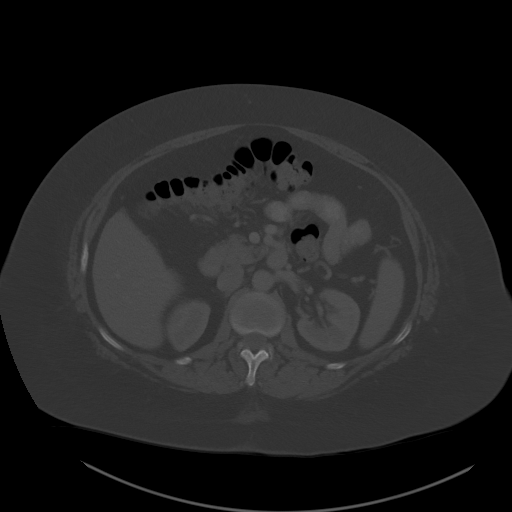
[im 72/99  soft-tissue]
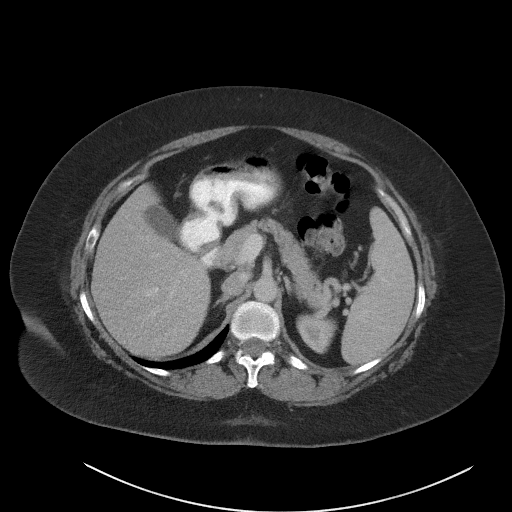
[im 79/99  soft-tissue]
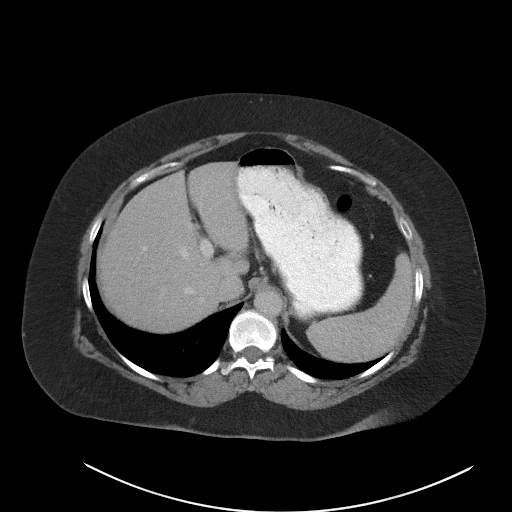
[im 85/99  soft-tissue]
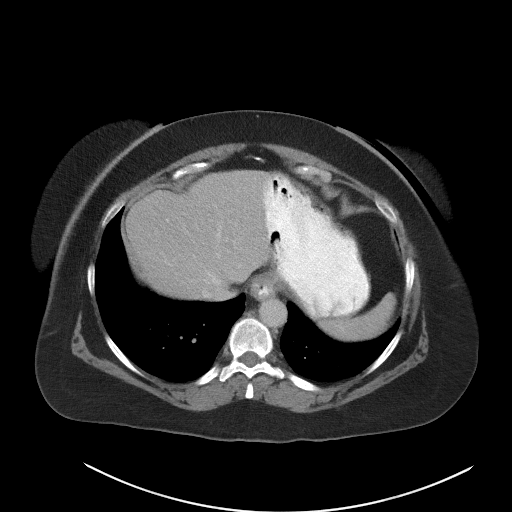
[im 92/99  soft-tissue]
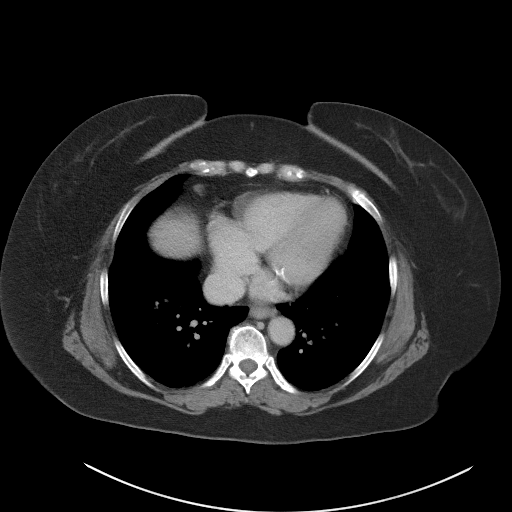

[Series 5: coronal st · coronal · 0.96mm/px · 3 of 117 slices shown]
[im 39/117  soft-tissue]
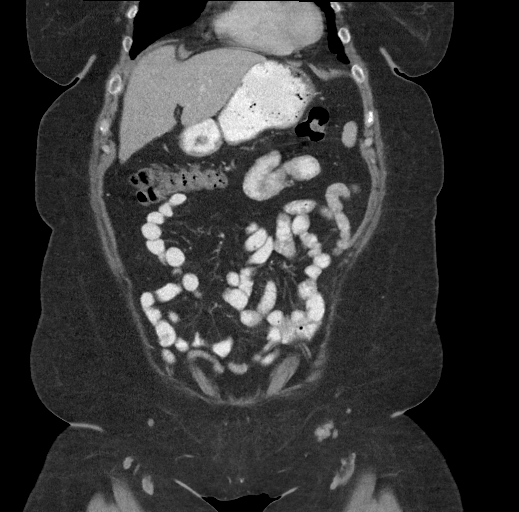
[im 52/117  soft-tissue]
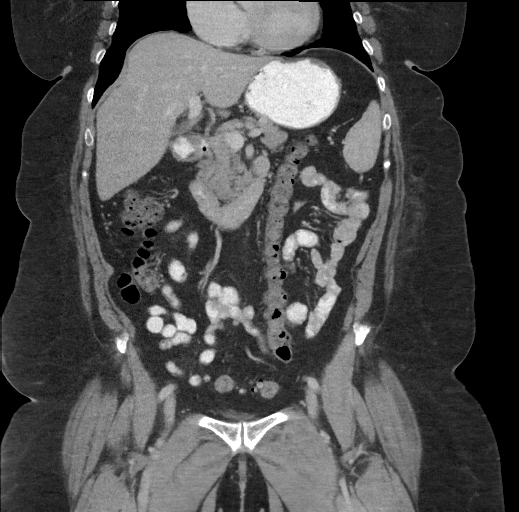
[im 65/117  soft-tissue]
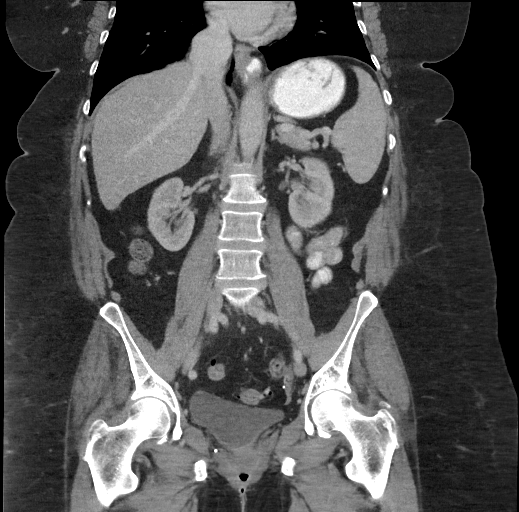

[16 of 46 positions shown; findings below may reference images not displayed]

FINDINGS: Lower chest: No acute abnormality.

Hepatobiliary: No focal liver abnormality. No gallstones,
gallbladder wall thickening, or pericholecystic fluid. No biliary
dilatation.

Pancreas: No focal lesion. Normal pancreatic contour. No surrounding
inflammatory changes. No main pancreatic ductal dilatation.

Spleen: Normal in size without focal abnormality.A splenule is noted
([DATE]).

Adrenals/Urinary Tract: No adrenal nodule bilaterally. Bilateral
kidneys enhance symmetrically. Punctate calcific density within the
left kidney noted. No hydronephrosis. No hydroureter. The urinary
bladder is unremarkable. On delayed imaging, there is no urothelial
wall thickening and there are no filling defects in the opacified
portions of the bilateral collecting systems or ureters.

Stomach/Bowel: Stomach is within normal limits. No evidence of bowel
wall thickening or dilatation. Diffuse sigmoid diverticulosis. The
descending colon is medialized compared to the small bowel. No
pneumatosis. Status post appendectomy.

Vascular/Lymphatic: No abdominal aorta or iliac aneurysm. Mild
atherosclerotic plaque of the aorta and its branches. No abdominal,
pelvic, or inguinal lymphadenopathy.

Reproductive: Status post hysterectomy. No adnexal masses.

Other: No intraperitoneal free fluid. No intraperitoneal free gas.
No organized fluid collection.

Musculoskeletal:

No abdominal wall hernia or abnormality.

No suspicious lytic or blastic osseous lesions. No acute displaced
fracture.
IMPRESSION: 1. Diffuse sigmoid diverticulosis with no acute diverticulitis.
2. Persistent descending mesocolon with a medialized descending
colon. No associated bowel obstruction or volvulus identified.
3. Nonobstructive punctate left nephrolithiasis.
4. Otherwise no acute intra-abdominal intrapelvic abnormality.

## 2022-12-11 ENCOUNTER — Other Ambulatory Visit: Payer: Self-pay | Admitting: Gastroenterology

## 2023-01-02 ENCOUNTER — Other Ambulatory Visit: Payer: Self-pay | Admitting: Internal Medicine

## 2023-02-01 ENCOUNTER — Ambulatory Visit: Payer: 59 | Attending: Student | Admitting: Student

## 2023-02-01 ENCOUNTER — Encounter: Payer: Self-pay | Admitting: Student

## 2023-02-01 ENCOUNTER — Encounter: Payer: Self-pay | Admitting: *Deleted

## 2023-02-01 VITALS — BP 126/70 | HR 69 | Ht 64.0 in | Wt 179.4 lb

## 2023-02-01 DIAGNOSIS — I480 Paroxysmal atrial fibrillation: Secondary | ICD-10-CM | POA: Diagnosis not present

## 2023-02-01 DIAGNOSIS — M352 Behcet's disease: Secondary | ICD-10-CM | POA: Diagnosis not present

## 2023-02-01 DIAGNOSIS — Z7901 Long term (current) use of anticoagulants: Secondary | ICD-10-CM

## 2023-02-01 MED ORDER — AZATHIOPRINE 50 MG PO TABS: 50.0000 mg | ORAL_TABLET | Freq: Two times a day (BID) | ORAL | Status: DC

## 2023-02-01 MED ORDER — APIXABAN 5 MG PO TABS: 5.0000 mg | ORAL_TABLET | Freq: Two times a day (BID) | ORAL | 3 refills | Status: DC

## 2023-02-01 MED ORDER — METOPROLOL SUCCINATE ER 50 MG PO TB24: 50.0000 mg | ORAL_TABLET | Freq: Every day | ORAL | 3 refills | Status: DC

## 2023-02-01 MED ORDER — METOPROLOL SUCCINATE ER 25 MG PO TB24: 25.0000 mg | ORAL_TABLET | Freq: Every day | ORAL | 3 refills | Status: DC

## 2023-02-01 NOTE — Patient Instructions (Signed)
Medication Instructions:   Continue current medications.    Follow-Up:  6 months with Dr. Wyline Mood or Randall An, PA  Any Other Special Instructions Will Be Listed Below (If Applicable).     If you need a refill on your cardiac medications before your next appointment, please call your pharmacy.

## 2023-02-01 NOTE — Progress Notes (Signed)
Cardiology Office Note    Date:  02/01/2023  ID:  Holly Krueger, DOB [birth year], MRN [id number] Cardiologist: Dina Rich, MD    History of Present Illness:    Holly Krueger is a 57 y.o. female with past medical history of paroxysmal atrial fibrillation, HTN, prediabetes and GERD who presents to the office today for 47-month follow-up.  She was examined by Dr. Wyline Mood in 08/2022 and denied any recent palpitations at that time.  She had lost over 50 pounds since being on Wegovy. She was continued on Toprol-XL 75 mg daily along with Eliquis 5 mg twice daily for anticoagulation.  In talking with the patient today, she reports overall doing well since her last office visit. She denies any recent chest pain or dyspnea on exertion. She has noticed a significant improvement in her energy with weight loss as she has lost over 60 pounds within the past year since being on Wegovy. She did [place] the zoo with [place] last [place] and reports feeling well with the significant amount of walking. She denies any recent orthopnea, PND or pitting edema. She did have an episode of palpitations in 09/2022 and took extra Lopressor but denies any recurrent events since. She remains on Eliquis for anticoagulation and denies any recent melena, hematochezia or hematuria. She is being followed by Ophthalmology for Behcet's syndrome.   Studies Reviewed:   EKG: EKG is ordered today and demonstrates NSR, HR 63 with LAFB. No acute ST changes when compared to prior tracings.   Echocardiogram: 04/2019 IMPRESSIONS     1. The left ventricle has normal systolic function with an ejection  fraction of 60-65%. The cavity size was normal. There is focal basal  septal hypertrophy. Left ventricular diastolic parameters were normal.   2. The right ventricle has normal systolic function. The cavity was  normal. There is no increase in right ventricular wall thickness.   3. Left atrial size was mildly dilated.   4. No evidence of  mitral valve stenosis.   5. The aortic valve is tricuspid. No stenosis of the aortic valve.   6. The aorta is normal in size and structure.   7. The aortic root is normal in size and structure.   8. Pulmonary hypertension is indeterminate, inadequate TR jet.    Physical Exam:   VS:  BP 126/70   Pulse 69   Ht  (1.626 m)   Wt 179 lb 6.4 oz (81.4 kg)   SpO2 97%   BMI 30.79 kg/m    Wt Readings from Last 3 Encounters:  02/01/23 179 lb 6.4 oz (81.4 kg)  08/18/22 192 lb 9.6 oz (87.4 kg)  06/28/22 193 lb (87.5 kg)     GEN: Well nourished, well developed female appearing in no acute distress NECK: No JVD; No carotid bruits CARDIAC: RRR, no murmurs, rubs, gallops RESPIRATORY:  Clear to auscultation without rales, wheezing or rhonchi  ABDOMEN: Appears non-distended. No obvious abdominal masses. EXTREMITIES: No clubbing or cyanosis. No pitting edema.  Distal pedal pulses are 2+ bilaterally.   Assessment and Plan:   1. Paroxysmal Atrial Fibrillation/Use of Long-term Anticoagulation - She denies any recent palpitations and is maintaining normal sinus rhythm by examination and EKG today. Continue Toprol-XL 75 mg daily for rate-control. She does have an Rx for short-acting Lopressor 25 mg to take as needed but has not utilized this recently. - No reports of active bleeding. She remains on Eliquis 5 mg twice daily for anticoagulation.  CBC in  08/2022 showed her hemoglobin was stable at 11.8 with platelets at 247 K.  She did have labs with her PCP and we will request copy of these as well.  2. Behcet's Syndrome - Followed by Ophthalmology. By review of literature, the most common cardiac implications appear to be myocarditis/pericarditis or valvular issues. She denies any recent chest pain and echocardiogram in 04/2019 did not show any significant valve abnormalities. Can plan for repeat imaging in a few years unless clinically indicated in the interim.  Signed, Ellsworth Lennox, PA-C

## 2023-02-06 ENCOUNTER — Encounter: Payer: Self-pay | Admitting: Family Medicine

## 2023-04-18 ENCOUNTER — Telehealth: Payer: 59 | Admitting: Physician Assistant

## 2023-04-18 DIAGNOSIS — B9689 Other specified bacterial agents as the cause of diseases classified elsewhere: Secondary | ICD-10-CM

## 2023-04-18 MED ORDER — AMOXICILLIN-POT CLAVULANATE 875-125 MG PO TABS: 1.0000 | ORAL_TABLET | Freq: Two times a day (BID) | ORAL | 0 refills | Status: DC

## 2023-04-18 MED ORDER — BENZONATATE 100 MG PO CAPS: 100.0000 mg | ORAL_CAPSULE | Freq: Three times a day (TID) | ORAL | 0 refills | Status: DC | PRN

## 2023-04-18 NOTE — Progress Notes (Signed)

## 2023-05-31 ENCOUNTER — Other Ambulatory Visit: Payer: Self-pay | Admitting: Oncology

## 2023-05-31 DIAGNOSIS — Z006 Encounter for examination for normal comparison and control in clinical research program: Secondary | ICD-10-CM

## 2023-06-06 ENCOUNTER — Other Ambulatory Visit (HOSPITAL_COMMUNITY)
Admission: RE | Admit: 2023-06-06 | Discharge: 2023-06-06 | Disposition: A | Discharge status: Home / Self Care | Payer: 59 | Source: Ambulatory Visit | Attending: Oncology | Admitting: Oncology

## 2023-06-06 DIAGNOSIS — Z006 Encounter for examination for normal comparison and control in clinical research program: Secondary | ICD-10-CM

## 2023-06-07 ENCOUNTER — Telehealth: Payer: 59 | Admitting: Physician Assistant

## 2023-06-07 ENCOUNTER — Other Ambulatory Visit (HOSPITAL_COMMUNITY)
Admission: RE | Admit: 2023-06-07 | Discharge: 2023-06-07 | Disposition: A | Discharge status: Home / Self Care | Payer: 59 | Source: Ambulatory Visit | Attending: Oncology | Admitting: Oncology

## 2023-06-07 ENCOUNTER — Other Ambulatory Visit: Payer: Self-pay | Admitting: Oncology

## 2023-06-07 DIAGNOSIS — Z139 Encounter for screening, unspecified: Secondary | ICD-10-CM | POA: Insufficient documentation

## 2023-06-07 DIAGNOSIS — R051 Acute cough: Secondary | ICD-10-CM

## 2023-06-07 DIAGNOSIS — J0101 Acute recurrent maxillary sinusitis: Secondary | ICD-10-CM

## 2023-06-07 MED ORDER — DOXYCYCLINE HYCLATE 100 MG PO TABS: 100.0000 mg | ORAL_TABLET | Freq: Two times a day (BID) | ORAL | 0 refills | Status: DC

## 2023-06-07 MED ORDER — BENZONATATE 200 MG PO CAPS: 200.0000 mg | ORAL_CAPSULE | Freq: Two times a day (BID) | ORAL | 0 refills | Status: DC | PRN

## 2023-06-07 NOTE — Progress Notes (Signed)

## 2023-06-07 NOTE — Addendum Note (Signed)
Addended by: Gilberto Better on: 06/07/2023 10:47 AM   Modules accepted: Orders

## 2023-06-13 ENCOUNTER — Telehealth: Payer: Self-pay | Admitting: Cardiology

## 2023-06-13 MED ORDER — METOPROLOL SUCCINATE ER 50 MG PO TB24: 50.0000 mg | ORAL_TABLET | Freq: Every day | ORAL | 3 refills | Status: DC

## 2023-06-13 NOTE — Telephone Encounter (Signed)
She needs a refill on Metoprolol 50MG 

## 2023-06-13 NOTE — Telephone Encounter (Signed)
Medication refill request sent to pharmacy

## 2023-06-27 LAB — GENECONNECT MOLECULAR SCREEN: Genetic Analysis Overall Interpretation: NEGATIVE

## 2023-06-28 ENCOUNTER — Other Ambulatory Visit: Payer: Self-pay | Admitting: Internal Medicine

## 2023-06-29 NOTE — Telephone Encounter (Signed)
Pt needs an ov . No more refills until scheduled

## 2023-06-29 NOTE — Telephone Encounter (Signed)
Pt was made aware, pt stated that she will call back at a later time to make an appt.

## 2023-07-12 ENCOUNTER — Other Ambulatory Visit (HOSPITAL_COMMUNITY)
Admission: RE | Admit: 2023-07-12 | Discharge: 2023-07-12 | Disposition: A | Discharge status: Home / Self Care | Payer: 59 | Source: Ambulatory Visit | Attending: Ophthalmology | Admitting: Ophthalmology

## 2023-07-12 DIAGNOSIS — M352 Behcet's disease: Secondary | ICD-10-CM | POA: Diagnosis present

## 2023-07-12 LAB — CBC WITH DIFFERENTIAL/PLATELET
Abs Immature Granulocytes: 0.01 10*3/uL (ref 0.00–0.07)
Basophils Absolute: 0 10*3/uL (ref 0.0–0.1)
Basophils Relative: 1 %
Eosinophils Absolute: 0.1 10*3/uL (ref 0.0–0.5)
Eosinophils Relative: 2 %
HCT: 38.1 % (ref 36.0–46.0)
Hemoglobin: 12.4 g/dL (ref 12.0–15.0)
Immature Granulocytes: 0 %
Lymphocytes Relative: 21 %
Lymphs Abs: 1.2 10*3/uL (ref 0.7–4.0)
MCH: 30 pg (ref 26.0–34.0)
MCHC: 32.5 g/dL (ref 30.0–36.0)
MCV: 92 fL (ref 80.0–100.0)
Monocytes Absolute: 0.4 10*3/uL (ref 0.1–1.0)
Monocytes Relative: 7 %
Neutro Abs: 4 10*3/uL (ref 1.7–7.7)
Neutrophils Relative %: 69 %
Platelets: 243 10*3/uL (ref 150–400)
RBC: 4.14 MIL/uL (ref 3.87–5.11)
RDW: 14.6 % (ref 11.5–15.5)
WBC: 5.7 10*3/uL (ref 4.0–10.5)
nRBC: 0 % (ref 0.0–0.2)

## 2023-07-12 LAB — COMPREHENSIVE METABOLIC PANEL
ALT: 15 U/L (ref 0–44)
AST: 17 U/L (ref 15–41)
Albumin: 3.9 g/dL (ref 3.5–5.0)
Alkaline Phosphatase: 73 U/L (ref 38–126)
Anion gap: 10 (ref 5–15)
BUN: 11 mg/dL (ref 6–20)
CO2: 24 mmol/L (ref 22–32)
Calcium: 9.2 mg/dL (ref 8.9–10.3)
Chloride: 104 mmol/L (ref 98–111)
Creatinine, Ser: 0.83 mg/dL (ref 0.44–1.00)
GFR, Estimated: >60 mL/min (ref >60)
Glucose, Bld: 77 mg/dL (ref 70–99)
Potassium: 4 mmol/L (ref 3.5–5.1)
Sodium: 138 mmol/L (ref 135–145)
Total Bilirubin: 0.5 mg/dL (ref 0.3–1.2)
Total Protein: 6.6 g/dL (ref 6.5–8.1)

## 2023-07-16 LAB — QUANTIFERON-TB GOLD PLUS (RQFGPL)
QuantiFERON Mitogen Value: >10 [IU]/mL
QuantiFERON Nil Value: 0.01 [IU]/mL
QuantiFERON TB1 Ag Value: 0.01 [IU]/mL
QuantiFERON TB2 Ag Value: 0.01 [IU]/mL

## 2023-07-16 LAB — QUANTIFERON-TB GOLD PLUS: QuantiFERON-TB Gold Plus: NEGATIVE

## 2023-08-03 ENCOUNTER — Ambulatory Visit: Payer: 59 | Admitting: Student

## 2023-08-09 ENCOUNTER — Ambulatory Visit: Payer: 59 | Attending: Student | Admitting: Student

## 2023-08-09 ENCOUNTER — Encounter: Payer: Self-pay | Admitting: Student

## 2023-08-09 VITALS — BP 116/62 | HR 73 | Ht 64.0 in | Wt 175.4 lb

## 2023-08-09 DIAGNOSIS — Z7901 Long term (current) use of anticoagulants: Secondary | ICD-10-CM | POA: Diagnosis not present

## 2023-08-09 DIAGNOSIS — I480 Paroxysmal atrial fibrillation: Secondary | ICD-10-CM | POA: Diagnosis not present

## 2023-08-09 MED ORDER — METOPROLOL SUCCINATE ER 25 MG PO TB24: 25.0000 mg | ORAL_TABLET | Freq: Every day | ORAL | 3 refills | Status: DC

## 2023-08-09 MED ORDER — METOPROLOL TARTRATE 25 MG PO TABS: ORAL_TABLET | ORAL | 1 refills | Status: DC

## 2023-08-09 NOTE — Progress Notes (Signed)
Cardiology Office Note    Date:  08/09/2023  ID:  Holly Krueger, DOB 1965-11-01, MRN [id number] Cardiologist: Dina Rich, MD    History of Present Illness:    Holly Krueger is a 57 y.o. female with past medical history of paroxysmal atrial fibrillation, HTN, prediabetes, Behcet's syndrome and GERD who presents to the office today for 22-month follow-up.  She was examined by myself in 01/2023 and was overall doing well at that time and had lost over 60 pounds since being started on Wegovy. She denied any recent anginal symptoms and her most recent episode of palpitations had been several months prior with no recurrence. She was continued on her current medical therapy with Toprol-XL 75 mg daily and Eliquis 5 mg twice daily for anticoagulation.  In talking with the patient today, she does report having more frequent palpitations within the past several months. She has been keeping track of this on her iWatch and episodes do appear consistent with atrial fibrillation and have been occurring every 1 to 2 months. Episodes typically last for a few hours and resolve. She notices palpitations and weakness when this occurs. She does take Metoprolol Tartrate when this occurs which helps as well. Denies any recent chest pain or dyspnea on exertion. No specific orthopnea, PND or pitting edema.  Studies Reviewed:   EKG: EKG is not ordered today.  Echocardiogram: 04/2019 IMPRESSIONS     1. The left ventricle has normal systolic function with an ejection  fraction of 60-65%. The cavity size was normal. There is focal basal  septal hypertrophy. Left ventricular diastolic parameters were normal.   2. The right ventricle has normal systolic function. The cavity was  normal. There is no increase in right ventricular wall thickness.   3. Left atrial size was mildly dilated.   4. No evidence of mitral valve stenosis.   5. The aortic valve is tricuspid. No stenosis of the aortic valve.   6. The aorta  is normal in size and structure.   7. The aortic root is normal in size and structure.   8. Pulmonary hypertension is indeterminate, inadequate TR jet.   Risk Assessment/Calculations:    CHA2DS2-VASc Score = 2   This indicates a 2.2% annual risk of stroke. The patient's score is based upon: CHF History: 0 HTN History: 1 Diabetes History: 0 Stroke History: 0 Vascular Disease History: 0 Age Score: 0 Gender Score: 1             Physical Exam:   VS:  BP 116/62   Pulse 73   Ht 5\' 4"  (1.626 m)   Wt 175 lb 6.4 oz (79.6 kg)   SpO2 96%   BMI 30.11 kg/m    Wt Readings from Last 3 Encounters:  08/09/23 175 lb 6.4 oz (79.6 kg)  02/01/23 179 lb 6.4 oz (81.4 kg)  08/18/22 192 lb 9.6 oz (87.4 kg)     GEN: Well nourished, well developed female appearing in no acute distress NECK: No JVD; No carotid bruits CARDIAC: RRR, no murmurs, rubs, gallops RESPIRATORY:  Clear to auscultation without rales, wheezing or rhonchi  ABDOMEN: Appears non-distended. No obvious abdominal masses. EXTREMITIES: No clubbing or cyanosis. No pitting edema.  Distal pedal pulses are 2+ bilaterally.   Assessment and Plan:   1. Paroxysmal Atrial Fibrillation/Use of Long-term Anticoagulation - She does report occasional palpitations and we reviewed medication options including titrating Toprol-XL from 75 mg daily to 100 mg daily and she wishes to hold off  on this for now unless symptoms become more frequent. I encouraged her to make [place] aware if she wishes to make this change in the future. We also discussed possible referral to EP in the future for consideration of antiarrhythmics if her burden increases in frequency or severity.   - Recent CBC and BMET showed no acute abnormalities. Will recheck a TSH and Mg given her more frequent episodes. - No reports of active bleeding. Continue Eliquis 5 mg twice daily for anticoagulation. CBC in 06/2023 showed hemoglobin was stable at 12.4 with platelets at 243  K.  Signed, Ellsworth Lennox, PA-C

## 2023-08-09 NOTE — Patient Instructions (Signed)
Medication Instructions:  Your physician recommends that you continue on your current medications as directed. Please refer to the Current Medication list given to you today.  *If you need a refill on your cardiac medications before your next appointment, please call your pharmacy*   Lab Work: Your physician recommends that you return for lab work.   If you have labs (blood work) drawn today and your tests are completely normal, you will receive your results only by: MyChart Message (if you have MyChart) OR A paper copy in the mail If you have any lab test that is abnormal or we need to change your treatment, we will call you to review the results.   Testing/Procedures: NONE    Follow-Up: At [facility], you and your health needs are our priority.  As part of our continuing mission to provide you with exceptional heart care, we have created designated Provider Care Teams.  These Care Teams include your primary Cardiologist (physician) and Advanced Practice Providers (APPs -  Physician Assistants and Nurse Practitioners) who all work together to provide you with the care you need, when you need it.  We recommend signing up for the patient portal called "MyChart".  Sign up information is provided on this After Visit Summary.  MyChart is used to connect with patients for Virtual Visits (Telemedicine).  Patients are able to view lab/test results, encounter notes, upcoming appointments, etc.  Non-urgent messages can be sent to your provider as well.   To learn more about what you can do with MyChart, go to [email or web address].    Your next appointment:   6 month(s)  Provider:   Dina Rich, MD or Randall An, PA-C    Other Instructions Thank you for choosing [facility]!

## 2023-10-01 ENCOUNTER — Other Ambulatory Visit: Payer: Self-pay | Admitting: Student

## 2023-12-08 ENCOUNTER — Encounter: Payer: Self-pay | Admitting: Pulmonary Disease

## 2024-02-01 ENCOUNTER — Telehealth: Admitting: Family Medicine

## 2024-02-01 DIAGNOSIS — J019 Acute sinusitis, unspecified: Secondary | ICD-10-CM

## 2024-02-01 DIAGNOSIS — B9689 Other specified bacterial agents as the cause of diseases classified elsewhere: Secondary | ICD-10-CM

## 2024-02-01 MED ORDER — BENZONATATE 100 MG PO CAPS: 100.0000 mg | ORAL_CAPSULE | Freq: Three times a day (TID) | ORAL | 0 refills | Status: DC | PRN

## 2024-02-01 MED ORDER — AMOXICILLIN-POT CLAVULANATE 875-125 MG PO TABS
1.0000 | ORAL_TABLET | Freq: Two times a day (BID) | ORAL | 0 refills | Status: DC
Start: 2024-02-01 — End: 2024-09-04

## 2024-02-01 NOTE — Progress Notes (Signed)
 I have spent 5 minutes in review of e-visit questionnaire, review and updating patient chart, medical decision making and response to patient.   Piedad Climes, PA-C

## 2024-02-01 NOTE — Addendum Note (Signed)
 Addended by: Farris Hong on: 02/01/2024 11:42 AM   Modules accepted: Orders

## 2024-02-01 NOTE — Progress Notes (Signed)
 Message sent to patient requesting further input regarding current symptoms. Awaiting patient response.

## 2024-02-01 NOTE — Progress Notes (Signed)

## 2024-03-22 ENCOUNTER — Other Ambulatory Visit: Payer: Self-pay | Admitting: Student

## 2024-03-22 DIAGNOSIS — I4891 Unspecified atrial fibrillation: Secondary | ICD-10-CM

## 2024-03-22 NOTE — Telephone Encounter (Signed)
 Eliquis  5mg  refill request received. Patient is 58 years old, weight-79.6kg, Crea-0.83 on 07/12/23, Diagnosis-Afib, and last seen by [place] on 08/09/23. Dose is appropriate based on dosing criteria. Will send in refill to requested pharmacy.

## 2024-05-21 ENCOUNTER — Ambulatory Visit: Attending: Cardiology | Admitting: Cardiology

## 2024-05-21 NOTE — Progress Notes (Deleted)
 Clinical Summary Ms. Lalanne is a 58 y.o.female  seen today for follow up of the following medical problems.    PAF -has been on toprol  50mg  daily - reports some occasional palpitations - episode few days ago in AM. Woke up, felt heart racing. Checked heart rate at home and 146, took her 50mg  of toprol . Symptoms kept going, took 25mg  that night - taking eliquis , sometimes forgets to take AM.    -no recent symptoms - compliant with meds - no bleeding on eliquis .    2. Weight loss - on wegovy, down 50 lbs.      Works in PACU at [employer] Past Medical History:  Diagnosis Date   Atrial fibrillation (HCC)    Behcet's syndrome (HCC)    Dysrhythmia    Endometriosis    GERD (gastroesophageal reflux disease)    Headache(784.0)    History of kidney stones    Hypertension    PAF (paroxysmal atrial fibrillation) (HCC)    PONV (postoperative nausea and vomiting)    Sleep apnea    SOB (shortness of breath)    Swelling of both lower extremities      No Known Allergies   Current Outpatient Medications  Medication Sig Dispense Refill   amoxicillin -clavulanate (AUGMENTIN ) 875-125 MG tablet Take 1 tablet by mouth 2 (two) times daily. 14 tablet 0   apixaban  (ELIQUIS ) 5 MG TABS tablet Take 1 tablet by mouth twice daily 180 tablet 1   azaTHIOprine  (IMURAN ) 50 MG tablet Take 1 tablet (50 mg total) by mouth 2 (two) times daily.     benzonatate  (TESSALON ) 100 MG capsule Take 1 capsule (100 mg total) by mouth 3 (three) times daily as needed for cough. 30 capsule 0   metoprolol  succinate (TOPROL  XL) 25 MG 24 hr tablet Take 1 tablet (25 mg total) by mouth daily. 90 tablet 3   metoprolol  succinate (TOPROL  XL) 50 MG 24 hr tablet Take 1 tablet (50 mg total) by mouth daily. Take with or immediately following a meal. 90 tablet 3   metoprolol  tartrate (LOPRESSOR ) 25 MG tablet TAKE 1 TABLET BY MOUTH EVERY 8 HOURS AS NEEDED FOR  PALPITATIONS 90 tablet 6   ondansetron  (ZOFRAN ) 4 MG tablet TAKE 1  TABLET BY MOUTH EVERY 8 HOURS AS NEEDED FOR NAUSEA OR VOMITING 30 tablet 0   pantoprazole  (PROTONIX ) 40 MG tablet Take 1 tablet (40 mg total) by mouth 2 (two) times daily before a meal. (Patient not taking: Reported on 08/09/2023) 60 tablet 11   QUEtiapine (SEROQUEL) 50 MG tablet Take 50 mg by mouth at bedtime as needed (sleep).  (Patient not taking: Reported on 08/09/2023)     Semaglutide-Weight Management (WEGOVY) 2.4 MG/0.75ML SOAJ Inject 2.4 mg into the skin once a week.     zolpidem  (AMBIEN ) 10 MG tablet Take 1 tablet (10 mg total) by mouth at bedtime as needed for sleep. Up 5 times weekly 30 tablet 5   No current facility-administered medications for this visit.     Past Surgical History:  Procedure Laterality Date   ABDOMINAL HYSTERECTOMY     APPENDECTOMY     BALLOON DILATION N/A 07/01/2022   Procedure: BALLOON DILATION;  Surgeon: Cindie Carlin POUR, DO;  Location: [facility];  Service: Endoscopy;  Laterality: N/A;   BIOPSY  07/01/2022   Procedure: BIOPSY;  Surgeon: Cindie Carlin POUR, DO;  Location: [facility];  Service: Endoscopy;;   COLONOSCOPY N/A 10/02/2015   Procedure: COLONOSCOPY;  Surgeon: Margo  LITTIE Haddock, MD;  Location: [facility];  Service: Endoscopy;  Laterality: N/A;  830    ESOPHAGOGASTRODUODENOSCOPY (EGD) WITH PROPOFOL  N/A 07/01/2022   Procedure: ESOPHAGOGASTRODUODENOSCOPY (EGD) WITH PROPOFOL ;  Surgeon: Cindie Carlin POUR, DO;  Location: [facility];  Service: Endoscopy;  Laterality: N/A;  10:15 am   FOOT SURGERY     GANGLION CYST EXCISION Right 09/23/2020   Procedure: EXCISION LIPOMA RIGHT FOOT;  Surgeon: Blinda Katz, DPM;  Location: [facility];  Service: Podiatry;  Laterality: Right;   NASAL SINUS SURGERY  2014     No Known Allergies    Family History  Problem Relation Age of Onset   Hyperlipidemia Mother    Hypertension Mother    Anxiety disorder Mother    Hypertension Father    Diabetes Father    Stroke Father      Social History Ms. Spicer  reports that she has never smoked. She has never been exposed to tobacco smoke. She has never used smokeless tobacco. Ms. Neises reports no history of alcohol use.   Review of Systems CONSTITUTIONAL: No weight loss, fever, chills, weakness or fatigue.  HEENT: Eyes: No visual loss, blurred vision, double vision or yellow sclerae.No hearing loss, sneezing, congestion, runny nose or sore throat.  SKIN: No rash or itching.  CARDIOVASCULAR:  RESPIRATORY: No shortness of breath, cough or sputum.  GASTROINTESTINAL: No anorexia, nausea, vomiting or diarrhea. No abdominal pain or blood.  GENITOURINARY: No burning on urination, no polyuria NEUROLOGICAL: No headache, dizziness, syncope, paralysis, ataxia, numbness or tingling in the extremities. No change in bowel or bladder control.  MUSCULOSKELETAL: No muscle, back pain, joint pain or stiffness.  LYMPHATICS: No enlarged nodes. No history of splenectomy.  PSYCHIATRIC: No history of depression or anxiety.  ENDOCRINOLOGIC: No reports of sweating, cold or heat intolerance. No polyuria or polydipsia.  SABRA   Physical Examination There were no vitals filed for this visit. There were no vitals filed for this visit.  Gen: resting comfortably, no acute distress HEENT: no scleral icterus, pupils equal round and reactive, no palptable cervical adenopathy,  CV Resp: Clear to auscultation bilaterally GI: abdomen is soft, non-tender, non-distended, normal bowel sounds, no hepatosplenomegaly MSK: extremities are warm, no edema.  Skin: warm, no rash Neuro:  no focal deficits Psych: appropriate affect   Diagnostic Studies  04/2019 echo  1. The left ventricle has normal systolic function with an ejection  fraction of 60-65%. The cavity size was normal. There is focal basal  septal hypertrophy. Left ventricular diastolic parameters were normal.   2. The right ventricle has normal systolic function. The cavity was  normal. There is no increase in right  ventricular wall thickness.   3. Left atrial size was mildly dilated.   4. No evidence of mitral valve stenosis.   5. The aortic valve is tricuspid. No stenosis of the aortic valve.   6. The aorta is normal in size and structure.   7. The aortic root is normal in size and structure.   8. Pulmonary hypertension is indeterminate, inadequate TR jet.     Assessment and Plan  1. PAF/acquired thrombophlia -no recent symptoms, doing well since we increased toprol  to 75mg  daily - continue current meds including eliquis  for stroke prevention      Dorn PHEBE Ross, M.D., F.A.C.C.

## 2024-05-23 ENCOUNTER — Encounter: Payer: Self-pay | Admitting: Cardiology

## 2024-06-16 ENCOUNTER — Other Ambulatory Visit: Payer: Self-pay | Admitting: Cardiology

## 2024-09-03 NOTE — Progress Notes (Unsigned)
 Cardiology Office Note    Date:  09/04/2024  ID:  Holly Krueger, DOB 03-23-1966, MRN [id number] Cardiologist: Alvan Carrier, MD { :  History of Present Illness:    Holly Krueger is a 58 y.o. female with past medical history of paroxysmal atrial fibrillation, HTN, prediabetes, Behcet's syndrome and GERD who presents to the office today for overdue follow-up.  She was last examined by myself in 07/2023 and reported more frequent palpitations over the past several months and episodes appeared most consistent with atrial fibrillation by her iWatch which had been occurring once every 1 to 2 months. Episodes would typically last for a few hours and then resolve. We reviewed options in regards to titrating Toprol -XL to 100 mg daily or referring to EP for consideration of antiarrhythmics or possible ablation but she wished to hold off at that time. Was continued on her current medical therapy with Eliquis  5 mg twice daily and Toprol -XL 75 mg daily.  In talking with the patient today, she reports overall feeling well since her last office visit. She denies any recent chest pain or dyspnea on exertion. No recent palpitations. Says she has reduced her caffeine intake since her last office visit and is now consuming approximately 2 Pepsi sodas a Holly as compared to 4-5 per Holly. No alcohol use. She has difficulty sleeping at night. No specific orthopnea, PND or pitting edema. She has not had labs with her PCP in over a year.  Studies Reviewed:   EKG: EKG is ordered today and demonstrates:   EKG Interpretation Date/Time:  Wednesday September 04 2024 10:01:30 EST Ventricular Rate:  67 PR Interval:  158 QRS Duration:  84 QT Interval:  416 QTC Calculation: 439 R Axis:   -64  Text Interpretation: Normal sinus rhythm Left anterior fascicular block No acute ST changes. Confirmed by Johnson Grate ([id number]) on 09/04/2024 10:08:06 AM       Echocardiogram: 04/2019 IMPRESSIONS     1. The left  ventricle has normal systolic function with an ejection  fraction of 60-65%. The cavity size was normal. There is focal basal  septal hypertrophy. Left ventricular diastolic parameters were normal.   2. The right ventricle has normal systolic function. The cavity was  normal. There is no increase in right ventricular wall thickness.   3. Left atrial size was mildly dilated.   4. No evidence of mitral valve stenosis.   5. The aortic valve is tricuspid. No stenosis of the aortic valve.   6. The aorta is normal in size and structure.   7. The aortic root is normal in size and structure.   8. Pulmonary hypertension is indeterminate, inadequate TR jet.    Risk Assessment/Calculations:   CHA2DS2-VASc Score = 2  This indicates a 2.2% annual risk of stroke. The patient's score is based upon: CHF History: 0 HTN History: 1 Diabetes History: 0 Stroke History: 0 Vascular Disease History: 0 Age Score: 0 Gender Score: 1   Physical Exam:   VS:  BP 122/78 (BP Location: Right Arm, Cuff Size: Normal)   Pulse 61   Ht 5' 4 (1.626 m)   Wt 168 lb (76.2 kg)   SpO2 96%   BMI 28.84 kg/m    Wt Readings from Last 3 Encounters:  09/04/24 168 lb (76.2 kg)  08/09/23 175 lb 6.4 oz (79.6 kg)  02/01/23 179 lb 6.4 oz (81.4 kg)     GEN: Well nourished, well developed female appearing in no acute distress NECK: No JVD;  No carotid bruits CARDIAC: RRR, no murmurs, rubs, gallops RESPIRATORY:  Clear to auscultation without rales, wheezing or rhonchi  ABDOMEN: Appears non-distended. No obvious abdominal masses. EXTREMITIES: No clubbing or cyanosis. No pitting edema.  Distal pedal pulses are 2+ bilaterally.   Assessment and Plan:   1. PAF (paroxysmal atrial fibrillation) (HCC) - She denies any recent palpitations and is maintaining normal sinus rhythm by examination and EKG today. She is listed as taking Toprol -XL 75 mg daily but has only been taking 50 mg daily for the past few months as she ran out of  refills of the 25 mg tablets. Given that symptoms have overall been well-controlled, will continue on Toprol -XL 50 mg daily for now. I encouraged her to make us  aware if symptoms increase in frequency or severity as this could be titrated to her prior dose. Will recheck CBC, CMET and TSH with upcoming labs.  2. Current use of long term anticoagulation - No reports of active bleeding. Continue Eliquis  5 mg twice daily for anticoagulation which is the appropriate dose given her age (58 yo), weight (168 lbs) and renal function. Will recheck a CBC and CMET with upcoming labs.   3. Screening for Lipid Disorders - Will recheck an FLP with upcoming labs. Not currently on statin therapy.   4. Prediabetes - Hgb A1c was at 5.8 in 2021. Will recheck with upcoming labs. Remains on Wegovy at this time.   Signed, Laymon CHRISTELLA Qua, PA-C

## 2024-09-04 ENCOUNTER — Ambulatory Visit: Attending: Student | Admitting: Student

## 2024-09-04 ENCOUNTER — Encounter: Payer: Self-pay | Admitting: Student

## 2024-09-04 VITALS — BP 122/78 | HR 61 | Ht 64.0 in | Wt 168.0 lb

## 2024-09-04 DIAGNOSIS — Z13220 Encounter for screening for lipoid disorders: Secondary | ICD-10-CM

## 2024-09-04 DIAGNOSIS — I480 Paroxysmal atrial fibrillation: Secondary | ICD-10-CM | POA: Diagnosis not present

## 2024-09-04 DIAGNOSIS — Z79899 Other long term (current) drug therapy: Secondary | ICD-10-CM

## 2024-09-04 DIAGNOSIS — Z7901 Long term (current) use of anticoagulants: Secondary | ICD-10-CM | POA: Diagnosis not present

## 2024-09-04 DIAGNOSIS — R7303 Prediabetes: Secondary | ICD-10-CM | POA: Diagnosis not present

## 2024-09-04 MED ORDER — APIXABAN 5 MG PO TABS
5.0000 mg | ORAL_TABLET | Freq: Two times a day (BID) | ORAL | 3 refills | Status: AC
Start: 2024-09-04 — End: ?

## 2024-09-04 MED ORDER — METOPROLOL SUCCINATE ER 50 MG PO TB24: 50.0000 mg | ORAL_TABLET | Freq: Every day | ORAL | 3 refills | Status: AC

## 2024-09-04 NOTE — Patient Instructions (Signed)
 Medication Instructions:  Your physician recommends that you continue on your current medications as directed. Please refer to the Current Medication list given to you today.  *If you need a refill on your cardiac medications before your next appointment, please call your pharmacy*  Lab Work: CBC CMET LIPID TSH A1C  If you have labs (blood work) drawn today and your tests are completely normal, you will receive your results only by: MyChart Message (if you have MyChart) OR A paper copy in the mail If you have any lab test that is abnormal or we need to change your treatment, we will call you to review the results.  Testing/Procedures: None  Follow-Up: At [facility], you and your health needs are our priority.  As part of our continuing mission to provide you with exceptional heart care, our providers are all part of one team.  This team includes your primary Cardiologist (physician) and Advanced Practice Providers or APPs (Physician Assistants and Nurse Practitioners) who all work together to provide you with the care you need, when you need it.  Your next appointment:   1 year(s)  Provider:   You may see Alvan Carrier, MD or one of the following Advanced Practice Providers on your designated Care Team:   Laymon Qua, PA-C  Scotesia Mulberry, [place] Olivia Pavy, [place]     We recommend signing up for the patient portal called MyChart.  Sign up information is provided on this After Visit Summary.  MyChart is used to connect with patients for Virtual Visits (Telemedicine).  Patients are able to view lab/test results, encounter notes, upcoming appointments, etc.  Non-urgent messages can be sent to your provider as well.   To learn more about what you can do with MyChart, go to [email or web address].   Other Instructions

## 2024-09-09 ENCOUNTER — Other Ambulatory Visit (HOSPITAL_COMMUNITY)
Admission: RE | Admit: 2024-09-09 | Discharge: 2024-09-09 | Disposition: A | Discharge status: Home / Self Care | Source: Ambulatory Visit | Attending: Student | Admitting: Student

## 2024-09-09 DIAGNOSIS — I480 Paroxysmal atrial fibrillation: Secondary | ICD-10-CM | POA: Diagnosis present

## 2024-09-09 DIAGNOSIS — Z79899 Other long term (current) drug therapy: Secondary | ICD-10-CM | POA: Diagnosis present

## 2024-09-09 DIAGNOSIS — R7303 Prediabetes: Secondary | ICD-10-CM | POA: Insufficient documentation

## 2024-09-09 DIAGNOSIS — Z13220 Encounter for screening for lipoid disorders: Secondary | ICD-10-CM | POA: Insufficient documentation

## 2024-09-09 DIAGNOSIS — Z7901 Long term (current) use of anticoagulants: Secondary | ICD-10-CM | POA: Diagnosis present

## 2024-09-09 LAB — CBC
HCT: 45.6 % (ref 36.0–46.0)
Hemoglobin: 15.3 g/dL — ABNORMAL HIGH (ref 12.0–15.0)
MCH: 30.7 pg (ref 26.0–34.0)
MCHC: 33.6 g/dL (ref 30.0–36.0)
MCV: 91.4 fL (ref 80.0–100.0)
Platelets: 285 K/uL (ref 150–400)
RBC: 4.99 MIL/uL (ref 3.87–5.11)
RDW: 13.3 % (ref 11.5–15.5)
WBC: 6.8 K/uL (ref 4.0–10.5)
nRBC: 0 % (ref 0.0–0.2)

## 2024-09-09 LAB — COMPREHENSIVE METABOLIC PANEL WITH GFR
ALT: 14 U/L (ref 0–44)
AST: 20 U/L (ref 15–41)
Albumin: 4.3 g/dL (ref 3.5–5.0)
Alkaline Phosphatase: 96 U/L (ref 38–126)
Anion gap: 9 (ref 5–15)
BUN: 9 mg/dL (ref 6–20)
CO2: 27 mmol/L (ref 22–32)
Calcium: 8.9 mg/dL (ref 8.9–10.3)
Chloride: 104 mmol/L (ref 98–111)
Creatinine, Ser: 0.77 mg/dL (ref 0.44–1.00)
GFR, Estimated: >60 mL/min (ref >60)
Glucose, Bld: 79 mg/dL (ref 70–99)
Potassium: 4.2 mmol/L (ref 3.5–5.1)
Sodium: 140 mmol/L (ref 135–145)
Total Bilirubin: 0.6 mg/dL (ref 0.0–1.2)
Total Protein: 6.6 g/dL (ref 6.5–8.1)

## 2024-09-09 LAB — HEMOGLOBIN A1C
Hgb A1c MFr Bld: 5.3 % (ref 4.8–5.6)
Mean Plasma Glucose: 105 mg/dL

## 2024-09-09 LAB — LIPID PANEL
Cholesterol: 220 mg/dL — ABNORMAL HIGH (ref 0–200)
HDL: 54 mg/dL (ref >40)
LDL Cholesterol: 146 mg/dL — ABNORMAL HIGH (ref 0–99)
Total CHOL/HDL Ratio: 4.1 ratio
Triglycerides: 100 mg/dL (ref <150)
VLDL: 20 mg/dL (ref 0–40)

## 2024-09-09 LAB — TSH: TSH: 1.31 u[IU]/mL (ref 0.350–4.500)

## 2024-09-10 ENCOUNTER — Ambulatory Visit: Payer: Self-pay | Admitting: Student

## 2024-09-23 ENCOUNTER — Other Ambulatory Visit (HOSPITAL_COMMUNITY): Payer: Self-pay | Admitting: Family Medicine

## 2024-09-23 DIAGNOSIS — Z1231 Encounter for screening mammogram for malignant neoplasm of breast: Secondary | ICD-10-CM

## 2024-09-30 ENCOUNTER — Ambulatory Visit (HOSPITAL_COMMUNITY)
Admission: RE | Admit: 2024-09-30 | Discharge: 2024-09-30 | Disposition: A | Source: Ambulatory Visit | Attending: Family Medicine | Admitting: Family Medicine

## 2024-09-30 ENCOUNTER — Encounter (HOSPITAL_COMMUNITY): Payer: Self-pay

## 2024-09-30 DIAGNOSIS — Z1231 Encounter for screening mammogram for malignant neoplasm of breast: Secondary | ICD-10-CM | POA: Diagnosis present

## 2024-10-02 ENCOUNTER — Ambulatory Visit (HOSPITAL_COMMUNITY)

## 2024-11-08 ENCOUNTER — Telehealth: Admitting: Family Medicine

## 2024-11-08 DIAGNOSIS — J111 Influenza due to unidentified influenza virus with other respiratory manifestations: Secondary | ICD-10-CM | POA: Diagnosis not present

## 2024-11-08 MED ORDER — OSELTAMIVIR PHOSPHATE 75 MG PO CAPS: 75.0000 mg | ORAL_CAPSULE | Freq: Two times a day (BID) | ORAL | 0 refills | Status: AC

## 2024-11-08 MED ORDER — PROMETHAZINE-DM 6.25-15 MG/5ML PO SYRP: 5.0000 mL | ORAL_SOLUTION | Freq: Four times a day (QID) | ORAL | 0 refills | Status: DC | PRN

## 2024-11-08 NOTE — Patient Instructions (Signed)

## 2024-11-08 NOTE — Progress Notes (Signed)
 " Virtual Visit Consent   Holly Krueger, you are scheduled for a virtual visit with a [facility] provider today. Just as with appointments in the office, your consent must be obtained to participate. Your consent will be active for this visit and any virtual visit you may have with one of our providers in the next 365 days. If you have a MyChart account, a copy of this consent can be sent to you electronically.  As this is a virtual visit, video technology does not allow for your provider to perform a traditional examination. This may limit your provider's ability to fully assess your condition. If your provider identifies any concerns that need to be evaluated in person or the need to arrange testing (such as labs, EKG, etc.), we will make arrangements to do so. Although advances in technology are sophisticated, we cannot ensure that it will always work on either your end or our end. If the connection with a video visit is poor, the visit may have to be switched to a telephone visit. With either a video or telephone visit, we are not always able to ensure that we have a secure connection.  By engaging in this virtual visit, you consent to the provision of healthcare and authorize for your insurance to be billed (if applicable) for the services provided during this visit. Depending on your insurance coverage, you may receive a charge related to this service.  I need to obtain your verbal consent now. Are you willing to proceed with your visit today? Holly Krueger has provided verbal consent on 11/08/2024 for a virtual visit (video or telephone). Loa Lamp, FNP  Date: 11/08/2024 9:26 AM   Virtual Visit via Video Note   I, Loa Lamp, connected with  Holly Krueger  ([id number], December 17, 1965) on 11/08/24 at  9:15 AM EST by a video-enabled telemedicine application and verified that I am speaking with the correct person using two identifiers.  Location: Patient: Virtual Visit Location Patient:  Home Provider: Virtual Visit Location Provider: Home Office   I discussed the limitations of evaluation and management by telemedicine and the availability of in person appointments. The patient expressed understanding and agreed to proceed.    History of Present Illness: Holly Krueger is a 59 y.o. who identifies as a female who was assigned female at birth, and is being seen today for nausea, sore throat, works at hospital, congestion, headache, sx started 2 days ago. Cough worse at night. Exposed to flu. SABRA  HPI: HPI  Problems:  Patient Active Problem List   Diagnosis Date Noted   At risk for activity intolerance 10/27/2020   Pre-diabetes 10/06/2020   Vitamin D  deficiency 10/06/2020   Other hyperlipidemia 10/06/2020   Depression 10/06/2020   At risk for diabetes mellitus 10/06/2020   Other fatigue 06/29/2020   Shortness of breath on exertion 06/29/2020   Essential hypertension 06/29/2020   Atrial fibrillation (HCC) 06/29/2020   Class 3 severe obesity with serious comorbidity and body mass index (BMI) of 40.0 to 44.9 in adult [facility]) 06/29/2020   Cystocele 12/23/2013   Perimenopausal vasomotor symptoms 03/21/2013    Allergies: Allergies[1] Medications: Current Medications[2]  Observations/Objective: Patient is well-developed, well-nourished in no acute distress.  Resting comfortably  at home.  Head is normocephalic, atraumatic.  No labored breathing.  Speech is clear and coherent with logical content.  Patient is alert and oriented at baseline.    Assessment and Plan: 1. Influenza (Primary)  Increase fluids, humidifier at night,  tylenol  or ibuprofen as directed, UC as needed,   Follow Up Instructions: I discussed the assessment and treatment plan with the patient. The patient was provided an opportunity to ask questions and all were answered. The patient agreed with the plan and demonstrated an understanding of the instructions.  A copy of instructions were sent to the  patient via MyChart unless otherwise noted below.     The patient was advised to call back or seek an in-person evaluation if the symptoms worsen or if the condition fails to improve as anticipated.    Bayron Dalto, FNP     [1] No Known Allergies [2]  Current Outpatient Medications:    apixaban  (ELIQUIS ) 5 MG TABS tablet, Take 1 tablet (5 mg total) by mouth 2 (two) times daily., Disp: 180 tablet, Rfl: 3   metoprolol  succinate (TOPROL -XL) 50 MG 24 hr tablet, Take 1 tablet (50 mg total) by mouth daily. Take with or immediately following a meal., Disp: 180 tablet, Rfl: 3   metoprolol  tartrate (LOPRESSOR ) 25 MG tablet, TAKE 1 TABLET BY MOUTH EVERY 8 HOURS AS NEEDED FOR  PALPITATIONS, Disp: 90 tablet, Rfl: 6   ondansetron  (ZOFRAN ) 4 MG tablet, TAKE 1 TABLET BY MOUTH EVERY 8 HOURS AS NEEDED FOR NAUSEA OR VOMITING, Disp: 30 tablet, Rfl: 0   Semaglutide-Weight Management (WEGOVY) 2.4 MG/0.75ML SOAJ, Inject 2.4 mg into the skin once a week., Disp: , Rfl:    zolpidem  (AMBIEN ) 10 MG tablet, Take 1 tablet (10 mg total) by mouth at bedtime as needed for sleep. Up 5 times weekly, Disp: 30 tablet, Rfl: 5  "

## 2024-11-15 ENCOUNTER — Telehealth: Admitting: Physician Assistant

## 2024-11-15 DIAGNOSIS — B9689 Other specified bacterial agents as the cause of diseases classified elsewhere: Secondary | ICD-10-CM | POA: Diagnosis not present

## 2024-11-15 DIAGNOSIS — J019 Acute sinusitis, unspecified: Secondary | ICD-10-CM

## 2024-11-15 MED ORDER — AMOXICILLIN-POT CLAVULANATE 875-125 MG PO TABS: 1.0000 | ORAL_TABLET | Freq: Two times a day (BID) | ORAL | 0 refills | Status: AC

## 2024-11-15 MED ORDER — BENZONATATE 100 MG PO CAPS: 100.0000 mg | ORAL_CAPSULE | Freq: Three times a day (TID) | ORAL | 0 refills | Status: AC | PRN

## 2024-11-15 NOTE — Progress Notes (Signed)
"      E-Visit for Sinus Problems  We are sorry that you are not feeling well.  Here is how we plan to help!  Based on what you have shared with me it looks like you have sinusitis.  Sinusitis is inflammation and infection in the sinus cavities of the head.  Based on your presentation I believe you most likely have Acute Bacterial Sinusitis.  This is an infection caused by bacteria and is treated with antibiotics. I have prescribed Augmentin  875mg /125mg  one tablet twice daily with food, for 7 days. I have also sent in Tessalon  for the cough.   You may use an oral decongestant such as Mucinex D or if you have glaucoma or high blood pressure use plain Mucinex. Saline nasal spray help and can safely be used as often as needed for congestion.  If you develop worsening sinus pain, fever or notice severe headache and vision changes, or if symptoms are not better after completion of antibiotic, please schedule an appointment with a health care provider.    Sinus infections are not as easily transmitted as other respiratory infection, however we still recommend that you avoid close contact with loved ones, especially the very young and elderly.  Remember to wash your hands thoroughly throughout the day as this is the number one way to prevent the spread of infection!  Home Care: Only take medications as instructed by your medical team. Complete the entire course of an antibiotic. Do not take these medications with alcohol. A steam or ultrasonic humidifier can help congestion.  You can place a towel over your head and breathe in the steam from hot water  coming from a faucet. Avoid close contacts especially the very young and the elderly. Cover your mouth when you cough or sneeze. Always remember to wash your hands.  Get Help Right Away If: You develop worsening fever or sinus pain. You develop a severe head ache or visual changes. Your symptoms persist after you have completed your treatment plan.  Make  sure you Understand these instructions. Will watch your condition. Will get help right away if you are not doing well or get worse.  Your e-visit answers were reviewed by a board certified advanced clinical practitioner to complete your personal care plan.  Depending on the condition, your plan could have included both over the counter or prescription medications.  If there is a problem please reply  once you have received a response from your provider.  Your safety is important to us .  If you have drug allergies check your prescription carefully.    You can use MyChart to ask questions about todays visit, request a non-urgent call back, or ask for a work or school excuse for 24 hours related to this e-Visit. If it has been greater than 24 hours you will need to follow up with your provider, or enter a new e-Visit to address those concerns.  You will get an e-mail in the next two days asking about your experience.  I hope that your e-visit has been valuable and will speed your recovery. Thank you for using e-visits.  I have spent 5 minutes in review of e-visit questionnaire, review and updating patient chart, medical decision making and response to patient.   Elsie Velma Lunger, PA-C     "
